# Patient Record
Sex: Female | Born: 1960 | Race: White | Hispanic: No | Marital: Married | Smoking: Never smoker
Health system: Southern US, Community
[De-identification: ages and names within clinical notes are randomized; demographics above are authoritative.]

## PROBLEM LIST (undated history)

## (undated) DIAGNOSIS — D259 Leiomyoma of uterus, unspecified: Secondary | ICD-10-CM

## (undated) DIAGNOSIS — K219 Gastro-esophageal reflux disease without esophagitis: Secondary | ICD-10-CM

## (undated) DIAGNOSIS — E039 Hypothyroidism, unspecified: Secondary | ICD-10-CM

## (undated) DIAGNOSIS — N840 Polyp of corpus uteri: Secondary | ICD-10-CM

## (undated) DIAGNOSIS — R112 Nausea with vomiting, unspecified: Secondary | ICD-10-CM

## (undated) DIAGNOSIS — Z85828 Personal history of other malignant neoplasm of skin: Secondary | ICD-10-CM

## (undated) DIAGNOSIS — H9190 Unspecified hearing loss, unspecified ear: Secondary | ICD-10-CM

## (undated) DIAGNOSIS — N938 Other specified abnormal uterine and vaginal bleeding: Secondary | ICD-10-CM

## (undated) DIAGNOSIS — E041 Nontoxic single thyroid nodule: Secondary | ICD-10-CM

## (undated) DIAGNOSIS — Z9889 Other specified postprocedural states: Secondary | ICD-10-CM

## (undated) DIAGNOSIS — Z85820 Personal history of malignant melanoma of skin: Secondary | ICD-10-CM

## (undated) DIAGNOSIS — N200 Calculus of kidney: Secondary | ICD-10-CM

## (undated) DIAGNOSIS — Z973 Presence of spectacles and contact lenses: Secondary | ICD-10-CM

## (undated) DIAGNOSIS — D500 Iron deficiency anemia secondary to blood loss (chronic): Secondary | ICD-10-CM

## (undated) DIAGNOSIS — K5900 Constipation, unspecified: Secondary | ICD-10-CM

## (undated) HISTORY — PX: BREAST EXCISIONAL BIOPSY: SUR124

## (undated) HISTORY — PX: BREAST BIOPSY: SHX20

## (undated) HISTORY — PX: APPENDECTOMY: SHX54

## (undated) HISTORY — PX: OTHER SURGICAL HISTORY: SHX169

## (undated) HISTORY — PX: WISDOM TOOTH EXTRACTION: SHX21

## (undated) HISTORY — PX: TONSILLECTOMY: SUR1361

---

## 2003-01-16 ENCOUNTER — Encounter: Admission: RE | Admit: 2003-01-16 | Discharge: 2003-01-16 | Payer: Self-pay | Admitting: Family Medicine

## 2003-01-16 ENCOUNTER — Encounter: Payer: Self-pay | Admitting: Family Medicine

## 2003-06-06 ENCOUNTER — Encounter: Payer: Self-pay | Admitting: Family Medicine

## 2003-06-06 ENCOUNTER — Encounter: Admission: RE | Admit: 2003-06-06 | Discharge: 2003-06-06 | Payer: Self-pay | Admitting: Family Medicine

## 2003-07-15 ENCOUNTER — Encounter: Admission: RE | Admit: 2003-07-15 | Discharge: 2003-08-14 | Payer: Self-pay | Admitting: Neurosurgery

## 2003-09-26 ENCOUNTER — Ambulatory Visit (HOSPITAL_COMMUNITY): Admission: RE | Admit: 2003-09-26 | Discharge: 2003-09-26 | Payer: Self-pay | Admitting: Neurosurgery

## 2004-01-30 ENCOUNTER — Encounter: Admission: RE | Admit: 2004-01-30 | Discharge: 2004-01-30 | Payer: Self-pay | Admitting: Family Medicine

## 2005-01-12 ENCOUNTER — Ambulatory Visit (HOSPITAL_COMMUNITY): Admission: RE | Admit: 2005-01-12 | Discharge: 2005-01-12 | Payer: Self-pay | Admitting: Gastroenterology

## 2005-07-08 ENCOUNTER — Inpatient Hospital Stay (HOSPITAL_COMMUNITY): Admission: AD | Admit: 2005-07-08 | Discharge: 2005-07-11 | Payer: Self-pay | Admitting: Obstetrics and Gynecology

## 2005-09-13 ENCOUNTER — Ambulatory Visit (HOSPITAL_COMMUNITY): Admission: AD | Admit: 2005-09-13 | Discharge: 2005-09-13 | Payer: Self-pay | Admitting: Obstetrics and Gynecology

## 2005-10-25 ENCOUNTER — Observation Stay (HOSPITAL_COMMUNITY): Admission: RE | Admit: 2005-10-25 | Discharge: 2005-10-25 | Payer: Self-pay | Admitting: Obstetrics and Gynecology

## 2005-10-27 ENCOUNTER — Encounter: Payer: Self-pay | Admitting: Obstetrics & Gynecology

## 2005-10-27 ENCOUNTER — Inpatient Hospital Stay (HOSPITAL_COMMUNITY): Admission: RE | Admit: 2005-10-27 | Discharge: 2005-10-30 | Payer: Self-pay | Admitting: Obstetrics and Gynecology

## 2005-11-09 ENCOUNTER — Ambulatory Visit (HOSPITAL_COMMUNITY): Admission: RE | Admit: 2005-11-09 | Discharge: 2005-11-09 | Payer: Self-pay | Admitting: Obstetrics & Gynecology

## 2005-11-28 ENCOUNTER — Other Ambulatory Visit: Admission: RE | Admit: 2005-11-28 | Discharge: 2005-11-28 | Payer: Self-pay | Admitting: Interventional Radiology

## 2005-11-28 ENCOUNTER — Encounter: Admission: RE | Admit: 2005-11-28 | Discharge: 2005-11-28 | Payer: Self-pay | Admitting: General Surgery

## 2005-11-28 ENCOUNTER — Encounter (INDEPENDENT_AMBULATORY_CARE_PROVIDER_SITE_OTHER): Payer: Self-pay | Admitting: *Deleted

## 2006-08-03 ENCOUNTER — Encounter: Admission: RE | Admit: 2006-08-03 | Discharge: 2006-08-03 | Payer: Self-pay | Admitting: General Surgery

## 2007-02-05 ENCOUNTER — Ambulatory Visit (HOSPITAL_COMMUNITY): Admission: RE | Admit: 2007-02-05 | Discharge: 2007-02-05 | Payer: Self-pay | Admitting: General Surgery

## 2008-12-08 ENCOUNTER — Ambulatory Visit (HOSPITAL_COMMUNITY): Admission: RE | Admit: 2008-12-08 | Discharge: 2008-12-08 | Payer: Self-pay | Admitting: Internal Medicine

## 2009-03-13 ENCOUNTER — Emergency Department (HOSPITAL_COMMUNITY): Admission: EM | Admit: 2009-03-13 | Discharge: 2009-03-13 | Payer: Self-pay | Admitting: Emergency Medicine

## 2010-12-09 ENCOUNTER — Ambulatory Visit: Payer: Self-pay | Admitting: Women's Health

## 2011-01-09 ENCOUNTER — Encounter: Payer: Self-pay | Admitting: Internal Medicine

## 2011-02-04 ENCOUNTER — Other Ambulatory Visit: Payer: Self-pay | Admitting: Women's Health

## 2011-02-04 ENCOUNTER — Other Ambulatory Visit (HOSPITAL_COMMUNITY)
Admission: RE | Admit: 2011-02-04 | Discharge: 2011-02-04 | Disposition: A | Discharge status: Home / Self Care | Payer: BC Managed Care – PPO | Source: Ambulatory Visit | Attending: Gynecology | Admitting: Gynecology

## 2011-02-04 ENCOUNTER — Ambulatory Visit (INDEPENDENT_AMBULATORY_CARE_PROVIDER_SITE_OTHER): Payer: BC Managed Care – PPO | Admitting: Women's Health

## 2011-02-04 DIAGNOSIS — Z01419 Encounter for gynecological examination (general) (routine) without abnormal findings: Secondary | ICD-10-CM

## 2011-02-04 DIAGNOSIS — R82998 Other abnormal findings in urine: Secondary | ICD-10-CM

## 2011-02-04 DIAGNOSIS — N920 Excessive and frequent menstruation with regular cycle: Secondary | ICD-10-CM

## 2011-02-04 DIAGNOSIS — Z124 Encounter for screening for malignant neoplasm of cervix: Secondary | ICD-10-CM | POA: Insufficient documentation

## 2011-02-22 ENCOUNTER — Encounter (INDEPENDENT_AMBULATORY_CARE_PROVIDER_SITE_OTHER): Payer: BC Managed Care – PPO | Admitting: Gynecology

## 2011-02-22 DIAGNOSIS — B3731 Acute candidiasis of vulva and vagina: Secondary | ICD-10-CM

## 2011-02-22 DIAGNOSIS — N898 Other specified noninflammatory disorders of vagina: Secondary | ICD-10-CM

## 2011-02-22 DIAGNOSIS — B373 Candidiasis of vulva and vagina: Secondary | ICD-10-CM

## 2011-03-14 ENCOUNTER — Ambulatory Visit: Payer: BC Managed Care – PPO | Admitting: Gynecology

## 2011-03-14 ENCOUNTER — Other Ambulatory Visit: Payer: BC Managed Care – PPO

## 2011-03-31 LAB — URINALYSIS, ROUTINE W REFLEX MICROSCOPIC
Bilirubin Urine: NEGATIVE
Ketones, ur: 15 mg/dL — AB
Leukocytes, UA: NEGATIVE
Nitrite: NEGATIVE
Urobilinogen, UA: 0.2 mg/dL (ref 0.0–1.0)
pH: 6 (ref 5.0–8.0)

## 2011-03-31 LAB — CBC
MCHC: 33.2 g/dL (ref 30.0–36.0)
MCV: 80.4 fL (ref 78.0–100.0)
Platelets: 274 10*3/uL (ref 150–400)
RBC: 4.32 MIL/uL (ref 3.87–5.11)
RDW: 16.3 % — ABNORMAL HIGH (ref 11.5–15.5)

## 2011-03-31 LAB — COMPREHENSIVE METABOLIC PANEL
ALT: 41 U/L — ABNORMAL HIGH (ref 0–35)
AST: 41 U/L — ABNORMAL HIGH (ref 0–37)
Albumin: 3.8 g/dL (ref 3.5–5.2)
CO2: 24 mEq/L (ref 19–32)
Calcium: 9 mg/dL (ref 8.4–10.5)
Creatinine, Ser: 0.68 mg/dL (ref 0.4–1.2)
GFR calc Af Amer: >60 mL/min (ref >60)
GFR calc non Af Amer: >60 mL/min (ref >60)
Sodium: 139 mEq/L (ref 135–145)
Total Protein: 6.4 g/dL (ref 6.0–8.3)

## 2011-03-31 LAB — LIPASE, BLOOD: Lipase: 20 U/L (ref 11–59)

## 2011-03-31 LAB — DIFFERENTIAL
Eosinophils Absolute: 0.1 10*3/uL (ref 0.0–0.7)
Eosinophils Relative: 1 % (ref 0–5)
Lymphocytes Relative: 12 % (ref 12–46)
Lymphs Abs: 1.2 10*3/uL (ref 0.7–4.0)
Monocytes Absolute: 0.4 10*3/uL (ref 0.1–1.0)
Monocytes Relative: 4 % (ref 3–12)

## 2011-05-06 NOTE — Discharge Summary (Signed)
NAME:  [name], [name]              ACCOUNT NO.:  [id number]   MEDICAL RECORD NO.:  [id number]          PATIENT TYPE:  OIB   LOCATION:  LDR2                          FACILITY:  [facility]   PHYSICIAN:  Tilda Burrow, M.D. DATE OF BIRTH:  [birth year]   DATE OF ADMISSION:  10/25/2005  DATE OF DISCHARGE:  11/07/2006LH                                 DISCHARGE SUMMARY   OBSERVATION SUMMARY:  Sandy White came to labor and delivery at approximately 6  a.m. with complaints of contractions.  She was examined upon admission and  she was noted to be 1.5 cm dilated, still fixed, soft and -2.  She was  observed for over 6 hours.  She is having contractions that are mild in  intensity, however, frequency is still a little regular at 5-7 minutes.  She  walked around for several hours and still the examination 6-1/2 hours after  her initial exam was still unchanged.  Fetal heart rate is reactive without  decelerations.  Options given to Sandy White were to stay in the hospital on  observation status and see what happens, go home with the plan to return to  labor and delivery when contractions get a little stronger and perhaps more  regular or any concerns such as decreased fetal movement, rupture of  membranes, bleeding or receive therapy with IM morphine with the idea that  the contractions will either stop or will become more efficient.  She chose  the option of going home and just return later on if the contractions  increase.  She was given instructions on kick counts and return and followup  if she has any concerns over fetal movement.  A cervical exam upon discharge  was still 1.5 cm, very thick, soft and -2 station.  She did have a reactive  nonstress test prior to discharge.      Jacklyn Shell, C.N.M.      Tilda Burrow, M.D.  Electronically Signed    FC/MEDQ  D:  10/25/2005  T:  10/25/2005  Job:  [id number]   cc:   [facility]

## 2011-05-06 NOTE — Op Note (Signed)
NAME:  Sandy White, Sandy White              ACCOUNT NO.:  [id number]   MEDICAL RECORD NO.:  [id number]          PATIENT TYPE:  INP   LOCATION:  A412                          FACILITY:  [facility]   PHYSICIAN:  Lazaro Arms, M.D.   DATE OF BIRTH:  Jan 14, 1961   DATE OF PROCEDURE:  DATE OF DISCHARGE:  10/30/2005                                 OPERATIVE REPORT   PREOPERATIVE DIAGNOSES:  1.  Intrauterine pregnancy at 8 weeks' gestation.  2.  Repetitive deep variable decelerations with occasional fetal bradycardic      episode.   POSTOPERATIVE DIAGNOSES:  1.  Intrauterine pregnancy at 56 weeks' gestation.  2.  Repetitive deep variable decelerations with occasional fetal bradycardic      episode.   PROCEDURE:  Primary low transverse cesarean section.   SURGEON:  Lazaro Arms, M.D.   ANESTHESIA:  Epidural.   FINDINGS:  Over a low transverse hysterotomy incision was delivered a viable  female infant with Apgars 8 and 9.  There was a three-vessel cord.  Cord  blood and cord gas sent.  The placenta was normal and intact.  There was no  nuchal cord.  Uterus, tubes, and ovaries were otherwise normal.   DESCRIPTION OF OPERATION:  The patient was taken to the operating room and  placed in the supine position with a roll under her right hip.  She had an  epidural that was working.   The patient was prepped and draped in the usual sterile fashion.  A  Pfannenstiel skin incision was made and carried down sharply to the rectus  fascia, scored in the midline and extended laterally.  The fascia was taken  off the muscles superiorly and inferiorly without difficulty.  The muscles  were divided.  The peritoneal cavity was entered.  Bladder blade was placed.  Vesicouterine serosa flap was created.  A low transverse hysterotomy  incision was made and over this incision was delivered a viable female with  Apgars 8 and 9.  Again, there was a three-vessel cord.  No nuchal cord.  No  other reason that we could  determine for the fetal heart rate changes.  The  infant was handed to Dr. Milford Cage, who was in attendance for routine neonatal  resuscitation.  The placenta was delivered spontaneously.  The uterus was  wiped clean with a lap pad, closed with two layers, the first being a  running interlocking layer and the second being an imbricating layer.  Bilateral tubal ligation was then performed without difficulty and good  hemostasis.  The pelvis was irrigated vigorously.  Muscles were  reapproximated loosely.  The fascia was closed using a 0 Vicryl  running.  Subcutaneous tissues were made hemostatic and irrigated.  Skin was  closed using skin staples.  The patient tolerated the procedure well.  She  experienced 700 mL of blood loss, taken to the recovery room in good stable  condition.  All counts were correct x3.      Lazaro Arms, M.D.  Electronically Signed     LHE/MEDQ  D:  11/16/2005  T:  11/16/2005  Job:  [id number]

## 2011-05-06 NOTE — Consult Note (Signed)
NAME:  Sandy White, Sandy White              ACCOUNT NO.:  [id number]   MEDICAL RECORD NO.:  [id number]          PATIENT TYPE:  OIB   LOCATION:  LDR1                          FACILITY:  [facility]   PHYSICIAN:  Tilda Burrow, M.D. DATE OF BIRTH:  20-May-1961   DATE OF CONSULTATION:  09/13/2005  DATE OF DISCHARGE:  09/13/2005                                   CONSULTATION   CHIEF COMPLAINT:  Uterine irritability.   HISTORY OF PRESENT ILLNESS:  This 50 year old gravida 4, para 3, AB 0 with  three living children, LMP January 30, 2005 placing [facility] November 06, 2005  with corresponding first and second trimester ultrasounds, second trimester  [facility] of October 30, 2005 based on 20 week ultrasound suggesting accelerated  fetal growth.  She has been followed through our office and presents to  labor and delivery complaining of a sense of not feeling well all day and  then presenting to labor and delivery complaining of some cramping.  External monitor shows the patient to be having irregular contractions every  5-8 minutes.  Cervix is multiparous, open external os 2 cm and internal os  closed.  Size appears greater than dates of 32 plus 3 weeks.   PAST MEDICAL HISTORY:  Positive for gastroesophageal reflux disease.   SURGICAL HISTORY:  Appendectomy and tonsillectomy.   CIGARETTES, ALCOHOL AND RECREATIONAL DRUGS:  Denied.   PHYSICAL EXAMINATION:  Notable for reactive fetal heart rate tracing,  abdominal exam showing a soft uterus measuring 33 cm, and ultrasound is  performed by Jannifer Franklin revealing polyhydramnios with singleton vertex  presentation and generous amniotic fluid with slight funneling of the lower  uterine segment and internal os.  As stated earlier digital exam shows the  external os 2 cm and internal os closed.   IMPRESSION:  Preterm labor secondary to polyhydramnios.   PLAN:  1.  Single dose terbutaline administered.  2.  We will give initial dose of betamethasone 12 mg IM  tonight.  3.  Monitor x1 hour.  If complete resolution of contractions we will send      home with followup.  Followup second dose of betamethasone Thursday      morning our office.  4.  Additionally, we will begin Valtrex suppression at this time with 500 mg      p.o. daily for duration of pregnancy.     Tilda Burrow, M.D.  Electronically Signed    JVF/MEDQ  D:  09/13/2005  T:  09/13/2005  Job:  [id number]

## 2011-05-06 NOTE — Discharge Summary (Signed)
NAME:  Sandy White, Sandy White              ACCOUNT NO.:  [id number]   MEDICAL RECORD NO.:  [id number]          PATIENT TYPE:  INP   LOCATION:  A412                          FACILITY:  [facility]   PHYSICIAN:  Lazaro Arms, M.D.   DATE OF BIRTH:  November 25, 1961   DATE OF ADMISSION:  10/27/2005  DATE OF DISCHARGE:  11/12/2006LH                                 DISCHARGE SUMMARY   PREOPERATIVE DIAGNOSES:  1.  Status post low transverse cesarean section and bilateral tubal      ligation.  2.  Repetitive fetal bradycardic episodes and variable accelerations.  3.  Unremarkable postoperative course.   PROCEDURES:  Low transverse cervical cesarean section/bilateral tubal  ligation.   HISTORY:  Please refer to the transcribed history and physical in antepartum  chart for details of admission to the hospital.   HOSPITAL COURSE:  Patient was admitted in early labor.  She underwent  epidural anesthesia without difficulty.  Blood pressure was stable.  Fetal  heart tracing started showing variable decelerations which were not  responsive to usual conservative measures.  As a result we proceeded with a  low transverse cesarean section.  Please refer to the op note for details.  She also had a tubal ligation.  Her postoperative course was unremarkable.  She tolerated clear liquids and a regular diet, voided without symptoms and  was ambulatory.  She had a stable hemoglobin and hematocrit at 9.5 postop  day #1 and #3.  She was discharged to home morning of postop day #3 in good  stable condition to follow up in the office next week to have staples  removed.  She was given Tylox for pain, instructions/precautions for return  prior to that time.      Lazaro Arms, M.D.  Electronically Signed     LHE/MEDQ  D:  11/16/2005  T:  11/16/2005  Job:  [id number]

## 2011-05-06 NOTE — Op Note (Signed)
NAME:  TOMORROW, [name]              ACCOUNT NO.:  [id number]   MEDICAL RECORD NO.:  [id number]          PATIENT TYPE:  INP   LOCATION:  A412                          FACILITY:  [facility]   PHYSICIAN:  Lazaro Arms, M.D.   DATE OF BIRTH:  [birth year]   DATE OF PROCEDURE:  10/27/2005  DATE OF DISCHARGE:  10/30/2005                                 OPERATIVE REPORT   PROCEDURE:  Epidural.   DESCRIPTION:  Sandy White is in early active labor.  She has gotten to 4 cm and  is requesting an epidural.  The patient is placed in the sitting position.  Betadine prep is used.  A 1% lidocaine is used as a local anesthetic.  The  area is field draped.  The L3-L4 interspace is identified.  A 17-gauge Tuohy  needle was used.  The loss of resistance technique employed.  The epidural  space is found with one pass without difficulty.  Ten mL of 0.125%  bupivacaine plain is given as a test dose without ill effects.  An  additional 10 mL is given to dose up the epidural.  The epidural catheter is  fed 5 cm into the epidural space.  The continuous infusion of 0.125%  bupivacaine with 2 mcg/mL of fentanyl is begun at 12 mL/hr.  The patient is  tolerating well, getting good relief with no drop in blood pressure.      Lazaro Arms, M.D.  Electronically Signed     LHE/MEDQ  D:  11/16/2005  T:  11/16/2005  Job:  [id number]

## 2011-05-06 NOTE — Discharge Summary (Signed)
NAME:  Sandy White, Sandy White              ACCOUNT NO.:  [id number]   MEDICAL RECORD NO.:  [id number]          PATIENT TYPE:  INP   LOCATION:  LDR3                          FACILITY:  [facility]   PHYSICIAN:  Tilda Burrow, M.D. DATE OF BIRTH:  01-07-1961   DATE OF ADMISSION:  07/08/2005  DATE OF DISCHARGE:  07/24/2006LH                                 DISCHARGE SUMMARY   ADMITTING DIAGNOSES:  1.  Pregnancy, 22 and 5/7th's weeks.  2.  Severe cervicitis, rule out chorioamnionitis.   DISCHARGE DIAGNOSES:  1.  Pregnancy, 23 weeks' gestation, not delivered.  2.  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   DISCHARGE MEDICATIONS:  Valtrex 500 mg p.o. b.i.d. x7 days, then one tablet  p.o. every day for duration of pregnancy.   HOSPITAL SUMMARY:  This 50 year old female was admitted as described in the  admitting history, after a two-day history of low-grade fever and discomfort  with speculum exam showing a fern test positive from the watery discharge  suggesting the possibility of membrane rupture but she was Nitrazine  negative.  The cervix had an ulcerative lesion suggestive of primary herpes.  A culture was obtained.  The patient was placed on clindamycin as well as  intravenous acyclovir 400 mg IV q.8h. with the patient discussed with Dr.  Ander Slade maternal/fetal medicine on call at [facility].  The patient  responded to antibiotics despite temperature to 102 on post-op day one with  subjective malaise improved gradually.  On July 10, 2005, the patient had  some continued vaginal discharge but markedly decreased from before.  On  July 11, 2005, she had mild uterine tenderness but no contractions.  Fetal  heart rate was in the normal range and so, the patient was considered stable  for discharge.   A torch titer had been ordered which was notable as follows:  Torch titer  revealed the patient to be negative for antibodies type 1 and type 2  (interestingly a culture from the office  subsequently returned positive).  Rubella immunity was absent.  Toxoplasmosis negative.  CMV, IgM was negative  as well.  HSV antibody titer was equivocal at 0.98 with normal being less  than 0.90.  Rubella antibodies were present.  Toxoplasmosis antibodies IgG  was negative.  Herpes simplex type 1 and 2 IgG was negative.   The patient was discharged home and one day later, after we found the  results in the office that showed positive HSV-2 culture from the cervix, we  resumed Valtrex at home.   This is a most interesting case in that the fern test prior to admission was  unequivocally positive and suggestive of membrane rupture, though clinical  circumstance turned out obviously otherwise.      Tilda Burrow, M.D.  Electronically Signed     JVF/MEDQ  D:  08/17/2005  T:  08/17/2005  Job:  [id number]   cc:   Tilda Burrow, M.D.  Fax: [phone]

## 2011-05-06 NOTE — Op Note (Signed)
Sandy White, Sandy White                ACCOUNT NO.:  [id number]   MEDICAL RECORD NO.:  [id number]          PATIENT TYPE:  AMB   LOCATION:  ENDO                         [facility]ACILITY:  [facility]   PHYSICIAN:  John C. Madilyn Fireman, M.D.    DATE OF BIRTH:  1961-11-09   DATE OF PROCEDURE:  01/12/2005  DATE OF DISCHARGE:                                 OPERATIVE REPORT   INDICATIONS FOR PROCEDURE:  Chronic solid food dysphagia associated with  reflux symptoms suggestive of esophageal ring or stricture.   PROCEDURE:  The patient was placed in the left lateral decubitus position  and placed on the pulse monitor with continuous low-flow oxygen delivered by  nasal cannula. She was sedated with 50 mcg IV fentanyl and 5 mg IV Versed.  The Olympus video endoscope was advanced under direct vision into the  oropharynx and esophagus. The esophagus was straight and of normal caliber  with squamocolumnar line at 38 cm with a fairly subtle esophageal ring or  stricture at the GE junction. There was general erythema and slight  friability of the mucosa in the distal esophagus and GE junction area but no  discrete erosions or ulcers. There is no resistance to passage of scope  beyond the GE junction. I could not clearly discern a hiatal hernia. Stomach  was entered. A small amount of liquid secretions were suctioned from the  fundus. Retroflexed view of cardia was unremarkable. The fundus, body,  antrum and pylorus all appeared normal. The duodenum was entered, and  both  bulb and second portion were well inspected and appeared to be within normal  limits. Savary guidewire was placed through the endoscope channel and the  scope withdrawn. Savary dilators of 16 and 17 mm were passed over the  guidewire with mild resistance and a small amount of blood seen on the last  dilator only. The last dilator was removed together with wire, and the  patient returned to the recovery room in stable condition. She tolerated the  procedure  well. There were no immediate complications.   IMPRESSION:  Lower esophageal ring or stricture.   PLAN:  Advance diet and observe response to dilatation. Will follow-up in  the office in a few weeks.      JCH/MEDQ  D:  01/12/2005  T:  01/12/2005  Job:  [id number]   cc:   Marjory Lies, M.D.  [address]  [place]  [place] [address]  Fax: [phone]

## 2011-05-06 NOTE — H&P (Signed)
NAME:  Sandy White, Sandy White NO.:  [id number]   MEDICAL RECORD NO.:  [id number]          PATIENT TYPE:  INP   LOCATION:  LDR2                          FACILITY:  [facility]   PHYSICIAN:  Tilda Burrow, M.D. DATE OF BIRTH:  1961-04-13   DATE OF ADMISSION:  10/27/2005  DATE OF DISCHARGE:  LH                                HISTORY & PHYSICAL   REASON FOR ADMISSION:  Pregnancy at 39 weeks in early labor.   HISTORY OF PRESENT ILLNESS:  Sandy White is a 50 year old gravida 4, para 3, EDC  November 19 who has been seen in the office yesterday with some mild  intermittent contractions with an unchanged cervix. She presents 3 cm with  regular uterine contractions.   PAST MEDICAL HISTORY:  Positive for HSV.   PAST SURGICAL HISTORY:  Positive for appendix and tonsils.   ALLERGIES:  She is allergic to CODEINE.   FAMILY HISTORY:  Positive for breast cancer and lung cancer.   SOCIAL HISTORY:  She is married. Her husband is present and supportive.   PRENATAL COURSE:  Essentially uneventful. 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 daily since September 15, 2005. Blood type is O positive. UDS is negative. Hepatitis B surface antigen  negative. HIV is negative. HSV is positive. Serology is nonreactive. Pap is  ASCUS. GC and chlamydia are negative. She declined AFP. GBS is negative.   PHYSICAL EXAMINATION:  Vital signs are stable. Fetal heart rate pattern is  stable. Uterine contractions were every 2 to 4 minutes, mild to moderate in  intensity. Cervix is 5 cm, 80% effaced, -1 station.   PLAN:  Expect vaginal delivery.      Zerita Boers, Lanier Clam      Tilda Burrow, M.D.  Electronically Signed    DL/MEDQ  D:  14/78/2956  T:  10/27/2005  Job:  [id number]   cc:   [facility]

## 2011-05-06 NOTE — H&P (Signed)
NAME:  Sandy White, Sandy White              ACCOUNT NO.:  [id number]   MEDICAL RECORD NO.:  [id number]          PATIENT TYPE:  INP   LOCATION:  LDR3                          FACILITY:  [facility]   PHYSICIAN:  Tilda Burrow, M.D. DATE OF BIRTH:  [birth year]   DATE OF ADMISSION:  07/08/2005  DATE OF DISCHARGE:  LH                                HISTORY & PHYSICAL   ADMISSION DIAGNOSES:  1.  Pregnancy at 22-5/7 weeks.  2.  Severe cervicitis, rule out chorioamnionitis.   HISTORY OF PRESENT ILLNESS:  This 50 year old female, G4, P3, AB0, LMP  January 30, 2005, with [facility] of November 06, 2005, with corresponding first  and second trimester ultrasounds.  She is 22-5/7 weeks by menstrual history.   PRENATAL COURSE:  Notable for advanced maternal age evaluated at [facility] with targeted ultrasound performed and no visible abnormalities  identified.  She had a uterine fibroid in the uterine fundus.  She had a  progesterone level of 24 which was excellent.  Pregnancy course has been  otherwise straight forward.   Current complaints began when she presented to our of office on July 06, 2005, after a 2-day history of a low-grade fever and discomfort at home.  Temperature at home was 101.  She complained of increased vaginal fluid and  urine on the morning of July 05, 2005.  Urinalysis and culture and  sensitivities were sent to the lab.  It has returned 10 to the 5th  lactobacillus felt to be contaminant.  Speculum exam showed that she had an  erythematous cervix with extensive leukorrhea.  Prior GC and Chlamydia  culture were negative.  Vaginal pH was 7.  A fern test was negative.  There  were numerous white cells in the vaginal secretions.  There was lots of  watery discharge which appeared to be weeping from the cervical tissues  themselves.  She was examined by Cyril Mourning and myself.  She was  treated for possible UTI and also treated for cervicitis receiving  azithromycin 500 mg with a  4-day supply, plus Keflex q.i.d. x7 days.  She  was followed up in 2 days.  She specifically denied any uterine contractions  and was instructed to return if any contractions developed or if she felt  worse.  Follow up 2 days later, today in the office, showed that she was  still leaking fluid and it had become heavier this morning since 4:30 a.m.  She had a fever of 101.8 this morning and continues to feel general malaise.  She specifically denies uterine contractions and at this point in time is  sent to labor and delivery after speculum exam reveals much heavier clear  discharge and the cervix is distinctly different as the entire cervix  anterior and posterior lips over the everted endocervical tissue shows a  white eschar and weepy tissue fluid.  The speculum in the posterior vaginal  vault shows generous fluid which is suspicious for membrane rupture or  transudate.  Crist Fat test is performed again and this time show characteristic  ferning,  and generous amountsof it.  She is admitted for treatment of  cervicitis, possible treatment of chorioamnionitis.  External monitoring at  the hospital here shows fetal heart rate at 16-165, with occasional variable  deceleration noted.  The fetus is extremely active.  Amniotic fluid is high  normal.  It should be noted that manipulation of the cervix with the  speculum showed no expulsion of fluid from the endocervical canal that  occurred.   PHYSICAL EXAMINATION:  VITAL SIGNS:  In the office today, vital signs showed  temperature 97.4, weight 144, blood pressure 124/64.  Temperature had  increased to 100.8 upon admission to the hospital when she was having chills  and fetal heart rate was increased.   ASSESSMENT/PLAN:  The case is discussed with Dr. Ander Slade, maternal fetal  medicine on-call physician at [facility].  Presentation was discussed  in detail and clinical management discussed.  Our plans are to treat for  possible chorioamnionitis  with ampicillin 2 g every 4 hours as well as  clindamycin 600 mg p.o. q.6h.  To obtain torch titer and also to treat with  intravenous acyclovir 400 mg IV q.8h. to cover.  I have discussed with  family the significant risk to the pregnancy that exists and we have  discussed options of care here versus transfer to tertiary facility.  The  family is aware that the option of transfer exists and they are comfortable  with staying here at this time.  Plans are for antibiotic therapy.  As  discussed with Dr. Ander Slade, if the patient does not improve in 48 hours, will  consider amniocentesis for assessment of amniotic fluid.       JVF/MEDQ  D:  07/08/2005  T:  07/08/2005  Job:  [id number]

## 2011-09-09 ENCOUNTER — Encounter: Payer: Self-pay | Admitting: Gynecology

## 2011-09-12 ENCOUNTER — Ambulatory Visit: Payer: BC Managed Care – PPO | Admitting: Women's Health

## 2011-09-29 ENCOUNTER — Other Ambulatory Visit (HOSPITAL_COMMUNITY): Payer: Self-pay | Admitting: Internal Medicine

## 2011-09-29 DIAGNOSIS — N200 Calculus of kidney: Secondary | ICD-10-CM

## 2011-09-30 ENCOUNTER — Ambulatory Visit (HOSPITAL_COMMUNITY): Payer: BC Managed Care – PPO

## 2011-10-10 ENCOUNTER — Encounter: Payer: Self-pay | Admitting: Women's Health

## 2011-10-10 ENCOUNTER — Ambulatory Visit (INDEPENDENT_AMBULATORY_CARE_PROVIDER_SITE_OTHER): Payer: BC Managed Care – PPO | Admitting: Women's Health

## 2011-10-10 VITALS — BP 120/70

## 2011-10-10 DIAGNOSIS — R350 Frequency of micturition: Secondary | ICD-10-CM

## 2011-10-10 DIAGNOSIS — R82998 Other abnormal findings in urine: Secondary | ICD-10-CM

## 2011-10-10 DIAGNOSIS — N920 Excessive and frequent menstruation with regular cycle: Secondary | ICD-10-CM

## 2011-10-10 DIAGNOSIS — G43909 Migraine, unspecified, not intractable, without status migrainosus: Secondary | ICD-10-CM | POA: Insufficient documentation

## 2011-10-10 NOTE — Progress Notes (Signed)
  Presents with a complaints was treated for UTI at primary care. UA today does show 2-3 WBCs and +1 bacteria will check a urine culture. Finished Septra about a week and a half ago, states feels much better. Also states cycles are extremely heavy and questions hysterectomy. Five-day monthly cycle/BTL. States uses 1-2 pads per hour, soils clothing, needs to get up at night 2-3 times due to heavy bleeding.  Exam: Abdomen is soft nontender, external genitalia is within normal limits, speculum exam cervix is pink scant discharge no odor or erythema were noted. Bimanual uterus is enlarged 8-10 weeks size, nontender, states does have a history of a fibroid.  Plan: Options reviewed, lysteda, ablation, hysterectomy. Will schedule sonohysterogram with Dr. Lily Peer, and review options at that time. Handout on ablation, hysterectomy were reviewed. Reviewed risks of surgery with infection, perforation, hemorrhage. Reviewed HER option is an office procedure. Will discuss at office visit with sonohysterogram.

## 2011-10-12 ENCOUNTER — Encounter: Payer: Self-pay | Admitting: Women's Health

## 2011-10-12 ENCOUNTER — Ambulatory Visit (INDEPENDENT_AMBULATORY_CARE_PROVIDER_SITE_OTHER): Payer: BC Managed Care – PPO | Admitting: Women's Health

## 2011-10-12 ENCOUNTER — Ambulatory Visit (INDEPENDENT_AMBULATORY_CARE_PROVIDER_SITE_OTHER): Payer: BC Managed Care – PPO

## 2011-10-12 VITALS — BP 120/70

## 2011-10-12 DIAGNOSIS — R1032 Left lower quadrant pain: Secondary | ICD-10-CM

## 2011-10-12 DIAGNOSIS — N920 Excessive and frequent menstruation with regular cycle: Secondary | ICD-10-CM

## 2011-10-12 DIAGNOSIS — D259 Leiomyoma of uterus, unspecified: Secondary | ICD-10-CM

## 2011-10-12 DIAGNOSIS — D251 Intramural leiomyoma of uterus: Secondary | ICD-10-CM

## 2011-10-12 DIAGNOSIS — N831 Corpus luteum cyst of ovary, unspecified side: Secondary | ICD-10-CM

## 2011-10-12 DIAGNOSIS — R350 Frequency of micturition: Secondary | ICD-10-CM

## 2011-10-12 MED ORDER — URIBEL 118 MG PO CAPS: 1.0000 | ORAL_CAPSULE | Freq: Four times a day (QID) | ORAL | Status: DC | PRN

## 2011-10-12 NOTE — Progress Notes (Signed)
  Presents with a complaint of left lower quadrant pain. Known fibroid uterus with menorrhagia and dysmenorrhea. Was seen in the office 2 days ago to check a urine for test of cure and left lower quadrant pain. She has been treated with Cipro, Macrobid and finally Septra by her primary care where she did get relief. States she still has some bladder discomfort, pressure and urgency, denies any pain at the end of the stream, and did have a negative urine culture on 10/22. Denies fever, no nausea/ vomiting/ diarrhea /constipation or changes in elimination. States has never had discomfort like this before, has missed work this week due to this discomfort.  Exam: no CVAT, abdominal exam, no rebound, slight discomfort in the suprapubic bladder area, some discomfort in the left lower quadrant that radiates towards the back with deep palpation. UA today does show 2-3 WBCs. .  Plan: Culture urine, ultrasound shows numerous intramural myomas. The endometrial cavity shows 2 echogenic foci 18 x 9mm and 11 x 7 mm. She is scheduled for a sonohysterogram. Right ovary does show a thick walled cyst 17 x 19 mm positive PFT, left ovary was normal. Patient states when any pressure with the ultrasound on the left side she did have increased pain. Will try Uribel 4 times a day for 2 weeks return for sonohysterogram with Dr. Lily Peer.

## 2012-02-17 ENCOUNTER — Other Ambulatory Visit (HOSPITAL_COMMUNITY): Payer: Self-pay | Admitting: Internal Medicine

## 2012-02-17 DIAGNOSIS — R1011 Right upper quadrant pain: Secondary | ICD-10-CM

## 2012-02-20 ENCOUNTER — Other Ambulatory Visit (HOSPITAL_COMMUNITY): Payer: Self-pay | Admitting: Internal Medicine

## 2012-02-20 ENCOUNTER — Ambulatory Visit (HOSPITAL_COMMUNITY)
Admission: RE | Admit: 2012-02-20 | Discharge: 2012-02-20 | Disposition: A | Discharge status: Home / Self Care | Payer: BC Managed Care – PPO | Source: Ambulatory Visit | Attending: Internal Medicine | Admitting: Internal Medicine

## 2012-02-20 DIAGNOSIS — Z139 Encounter for screening, unspecified: Secondary | ICD-10-CM

## 2012-02-20 DIAGNOSIS — R1011 Right upper quadrant pain: Secondary | ICD-10-CM

## 2012-02-20 DIAGNOSIS — K7689 Other specified diseases of liver: Secondary | ICD-10-CM | POA: Insufficient documentation

## 2012-02-21 ENCOUNTER — Other Ambulatory Visit (HOSPITAL_COMMUNITY): Payer: Self-pay | Admitting: Internal Medicine

## 2012-02-21 DIAGNOSIS — R1011 Right upper quadrant pain: Secondary | ICD-10-CM

## 2012-02-23 ENCOUNTER — Encounter (HOSPITAL_COMMUNITY)
Admission: RE | Admit: 2012-02-23 | Discharge: 2012-02-23 | Disposition: A | Discharge status: Home / Self Care | Payer: BC Managed Care – PPO | Source: Ambulatory Visit | Attending: Internal Medicine | Admitting: Internal Medicine

## 2012-02-23 ENCOUNTER — Encounter (HOSPITAL_COMMUNITY): Payer: Self-pay

## 2012-02-23 DIAGNOSIS — R1011 Right upper quadrant pain: Secondary | ICD-10-CM

## 2012-02-23 MED ORDER — TECHNETIUM TC 99M MEBROFENIN IV KIT
5.0000 | PACK | Freq: Once | INTRAVENOUS | Status: AC | PRN
  Administered 2012-02-23: 5.6 via INTRAVENOUS

## 2012-02-23 MED ORDER — SINCALIDE 5 MCG IJ SOLR
INTRAMUSCULAR | Status: AC
  Administered 2012-02-23: 1.27 ug
  Filled 2012-02-23: qty 5

## 2012-02-27 ENCOUNTER — Ambulatory Visit (HOSPITAL_COMMUNITY)
Admission: RE | Admit: 2012-02-27 | Discharge: 2012-02-27 | Disposition: A | Discharge status: Home / Self Care | Payer: BC Managed Care – PPO | Source: Ambulatory Visit | Attending: Internal Medicine | Admitting: Internal Medicine

## 2012-02-27 DIAGNOSIS — Z139 Encounter for screening, unspecified: Secondary | ICD-10-CM

## 2012-02-27 DIAGNOSIS — Z1231 Encounter for screening mammogram for malignant neoplasm of breast: Secondary | ICD-10-CM | POA: Insufficient documentation

## 2012-02-29 ENCOUNTER — Other Ambulatory Visit: Payer: Self-pay | Admitting: Internal Medicine

## 2012-02-29 DIAGNOSIS — R928 Other abnormal and inconclusive findings on diagnostic imaging of breast: Secondary | ICD-10-CM

## 2012-03-06 ENCOUNTER — Ambulatory Visit
Admission: RE | Admit: 2012-03-06 | Discharge: 2012-03-06 | Disposition: A | Discharge status: Home / Self Care | Payer: BC Managed Care – PPO | Source: Ambulatory Visit | Attending: Internal Medicine | Admitting: Internal Medicine

## 2012-03-06 DIAGNOSIS — R928 Other abnormal and inconclusive findings on diagnostic imaging of breast: Secondary | ICD-10-CM

## 2012-03-07 ENCOUNTER — Encounter (INDEPENDENT_AMBULATORY_CARE_PROVIDER_SITE_OTHER): Payer: Self-pay | Admitting: *Deleted

## 2012-03-21 ENCOUNTER — Ambulatory Visit (INDEPENDENT_AMBULATORY_CARE_PROVIDER_SITE_OTHER): Payer: BC Managed Care – PPO | Admitting: Internal Medicine

## 2012-03-21 ENCOUNTER — Inpatient Hospital Stay (HOSPITAL_COMMUNITY): Admission: RE | Admit: 2012-03-21 | Payer: BC Managed Care – PPO | Source: Ambulatory Visit

## 2012-03-26 ENCOUNTER — Ambulatory Visit (INDEPENDENT_AMBULATORY_CARE_PROVIDER_SITE_OTHER): Payer: BC Managed Care – PPO | Admitting: Internal Medicine

## 2012-04-04 ENCOUNTER — Encounter (INDEPENDENT_AMBULATORY_CARE_PROVIDER_SITE_OTHER): Payer: Self-pay | Admitting: Internal Medicine

## 2012-04-04 ENCOUNTER — Encounter (INDEPENDENT_AMBULATORY_CARE_PROVIDER_SITE_OTHER): Payer: Self-pay | Admitting: *Deleted

## 2012-04-04 ENCOUNTER — Ambulatory Visit (INDEPENDENT_AMBULATORY_CARE_PROVIDER_SITE_OTHER): Payer: BC Managed Care – PPO | Admitting: Internal Medicine

## 2012-04-04 ENCOUNTER — Other Ambulatory Visit (INDEPENDENT_AMBULATORY_CARE_PROVIDER_SITE_OTHER): Payer: Self-pay | Admitting: *Deleted

## 2012-04-04 VITALS — BP 84/70 | HR 60 | Temp 98.1°F | Ht 62.0 in | Wt 138.4 lb

## 2012-04-04 DIAGNOSIS — E039 Hypothyroidism, unspecified: Secondary | ICD-10-CM

## 2012-04-04 DIAGNOSIS — K219 Gastro-esophageal reflux disease without esophagitis: Secondary | ICD-10-CM

## 2012-04-04 DIAGNOSIS — N3010 Interstitial cystitis (chronic) without hematuria: Secondary | ICD-10-CM

## 2012-04-04 NOTE — Progress Notes (Signed)
Subjective:     Patient ID: Sandy White, female   DOB: 1961-11-05, 51 y.o.   MRN: [id number]  HPI Referred by Dr. Ouida Sills for epigastric pain radiating around rt upper quadrant.  Her symptoms started 2 months ago.  Symptoms worse after eating .She says for a while it was a constant pain.  She rated the pain 9/10. The first episode was 2 yrs ago and she thought she was having a heart attack and she presented to the ED.  She has acid reflux usually at night every night.  Foods have also been slow to go down. She has a hx of dysphagia and underwent an EGD/ED in 2006 by Dr. Madilyn Fireman.  ( see below) She is not only any medication for this.  There has been no weight loss. No nausea or vomiting.  She has a BM once a day. No melena or bright red rectal bleeding.  03/02/2012:Total bili 0.3, ALP 87, AST 33, ALT 46, H and H. 13.6 and 40.2, MCV 89 02/23/2012 HIDA scan: patenct of cystic and CBD. Normal GB EF. 96%.  02/20/2012: US abdomen: CBD 5mm.  Negative abdominal US. 10/26/2012 CT abdomen for hematuria:  No acute findings within the abdomen or pelvis. No findings to explain hematuria. Fibroid uterus.Ordered by [facility]. . CT hematuria protocol.   2006 EGD/ED Dr. Vertell Novak REPORT  INDICATIONS FOR PROCEDURE: Chronic solid food dysphagia.  IMPRESSION: Lower esophageal ring or stricture.  PLAN: Advance diet and observe response to dilatation. Will follow-up in  the office in a few weeks.    Review of Systems     Current Outpatient Prescriptions  Medication Sig Dispense Refill  . Levothyroxine Sodium (SYNTHROID PO) Take 10 mcg by mouth daily.       . Meth-Hyo-M Bl-Na Phos-Ph Sal (URIBEL) 118 MG CAPS Take 1 capsule (118 mg total) by mouth 4 (four) times daily as needed.  56 capsule  1  . solifenacin (VESICARE) 10 MG tablet Take 5 mg by mouth daily.       Past Medical History  Diagnosis Date  . Thyroid disease     Hypothyroid  . Anemia   . Fibroid   . Migraine   .  Interstitial cystitis    Past Surgical History  Procedure Date  . Cesarean section 10/27/2005    girl  . Tubal ligation 10/27/2005   History   Social History  . Marital Status: Married    Spouse Name: N/A    Number of Children: N/A  . Years of Education: N/A   Occupational History  . Not on file.   Social History Main Topics  . Smoking status: Never Smoker   . Smokeless tobacco: Never Used  . Alcohol Use: No  . Drug Use: No  . Sexually Active: Yes -- Female partner(s)    Birth Control/ Protection: Surgical     btl   Other Topics Concern  . Not on file   Social History Narrative  . No narrative on file   Family Status  Relation Status Death Age  . Mother Deceased   . Father Deceased     MI age  . Sister Alive   . Maternal Grandmother Deceased   . Sister Alive    Allergies  Allergen Reactions  . Codeine     Objective:   Physical Exam Filed Vitals:   04/04/12 1600  Height: 5\' 2"  (1.575 m)  Weight: 138 lb 6.4 oz (62.778 kg)  Alert and oriented. Skin warm  and dry. Oral mucosa is moist.   . Sclera anicteric, conjunctivae is pink. Thyroid not enlarged. No cervical lymphadenopathy. Lungs clear. Heart regular rate and rhythm.  Abdomen is soft. Bowel sounds are positive. No hepatomegaly. No abdominal masses felt. No tenderness.  No edema to lower extremities. Patient is alert and oriented.      Assessment:   Possible GERD. PUD needs to be ruled out. Esophageal stricture also needs to be ruled out given hx of dysphagia and prior hx of stricture.    Plan:   EGD/ED. Samples of Dexilant given to patient (3 boxes given to patient)

## 2012-04-04 NOTE — Patient Instructions (Signed)
EGD/ED. Dexilant samples given to patient. 3 boxes

## 2012-04-06 ENCOUNTER — Encounter (INDEPENDENT_AMBULATORY_CARE_PROVIDER_SITE_OTHER): Payer: Self-pay

## 2012-04-11 MED ORDER — SODIUM CHLORIDE 0.45 % IV SOLN: Freq: Once | INTRAVENOUS | Status: DC

## 2012-05-16 ENCOUNTER — Encounter (HOSPITAL_COMMUNITY): Admission: RE | Payer: Self-pay | Source: Ambulatory Visit

## 2012-05-16 ENCOUNTER — Ambulatory Visit (HOSPITAL_COMMUNITY)
Admission: RE | Admit: 2012-05-16 | Payer: BC Managed Care – PPO | Source: Ambulatory Visit | Admitting: Internal Medicine

## 2012-05-16 SURGERY — ESOPHAGOGASTRODUODENOSCOPY (EGD) WITH ESOPHAGEAL DILATION
Anesthesia: Moderate Sedation

## 2013-02-26 ENCOUNTER — Other Ambulatory Visit: Payer: Self-pay

## 2013-02-26 DIAGNOSIS — Z1231 Encounter for screening mammogram for malignant neoplasm of breast: Secondary | ICD-10-CM

## 2013-04-03 ENCOUNTER — Ambulatory Visit: Payer: BC Managed Care – PPO

## 2013-04-22 ENCOUNTER — Ambulatory Visit
Admission: RE | Admit: 2013-04-22 | Discharge: 2013-04-22 | Disposition: A | Discharge status: Home / Self Care | Payer: BC Managed Care – PPO | Source: Ambulatory Visit

## 2013-04-22 DIAGNOSIS — Z1231 Encounter for screening mammogram for malignant neoplasm of breast: Secondary | ICD-10-CM

## 2013-06-18 HISTORY — PX: OTHER SURGICAL HISTORY: SHX169

## 2013-07-04 ENCOUNTER — Other Ambulatory Visit: Payer: Self-pay | Admitting: Internal Medicine

## 2013-07-04 DIAGNOSIS — N63 Unspecified lump in unspecified breast: Secondary | ICD-10-CM

## 2013-07-16 ENCOUNTER — Ambulatory Visit
Admission: RE | Admit: 2013-07-16 | Discharge: 2013-07-16 | Disposition: A | Discharge status: Home / Self Care | Payer: BC Managed Care – PPO | Source: Ambulatory Visit | Attending: Internal Medicine | Admitting: Internal Medicine

## 2013-07-16 DIAGNOSIS — N63 Unspecified lump in unspecified breast: Secondary | ICD-10-CM

## 2014-04-29 ENCOUNTER — Other Ambulatory Visit: Payer: Self-pay

## 2014-04-29 DIAGNOSIS — Z1231 Encounter for screening mammogram for malignant neoplasm of breast: Secondary | ICD-10-CM

## 2014-06-05 ENCOUNTER — Ambulatory Visit: Payer: BC Managed Care – PPO

## 2014-06-09 ENCOUNTER — Ambulatory Visit: Payer: BC Managed Care – PPO

## 2014-07-01 ENCOUNTER — Ambulatory Visit: Payer: BC Managed Care – PPO

## 2014-07-17 ENCOUNTER — Ambulatory Visit: Payer: BC Managed Care – PPO

## 2014-10-20 ENCOUNTER — Encounter (INDEPENDENT_AMBULATORY_CARE_PROVIDER_SITE_OTHER): Payer: Self-pay | Admitting: Internal Medicine

## 2015-02-05 ENCOUNTER — Ambulatory Visit (INDEPENDENT_AMBULATORY_CARE_PROVIDER_SITE_OTHER): Payer: BC Managed Care – PPO | Admitting: Gynecology

## 2015-02-05 ENCOUNTER — Other Ambulatory Visit (HOSPITAL_COMMUNITY)
Admission: RE | Admit: 2015-02-05 | Discharge: 2015-02-05 | Disposition: A | Discharge status: Home / Self Care | Payer: BC Managed Care – PPO | Source: Ambulatory Visit | Attending: Gynecology | Admitting: Gynecology

## 2015-02-05 ENCOUNTER — Encounter: Payer: Self-pay | Admitting: Gynecology

## 2015-02-05 VITALS — BP 132/86 | Ht 63.0 in | Wt 142.0 lb

## 2015-02-05 DIAGNOSIS — N8320 Unspecified ovarian cysts: Secondary | ICD-10-CM

## 2015-02-05 DIAGNOSIS — Z01419 Encounter for gynecological examination (general) (routine) without abnormal findings: Secondary | ICD-10-CM | POA: Insufficient documentation

## 2015-02-05 DIAGNOSIS — N3010 Interstitial cystitis (chronic) without hematuria: Secondary | ICD-10-CM

## 2015-02-05 DIAGNOSIS — E039 Hypothyroidism, unspecified: Secondary | ICD-10-CM

## 2015-02-05 DIAGNOSIS — Z1151 Encounter for screening for human papillomavirus (HPV): Secondary | ICD-10-CM | POA: Insufficient documentation

## 2015-02-05 DIAGNOSIS — N83201 Unspecified ovarian cyst, right side: Secondary | ICD-10-CM | POA: Insufficient documentation

## 2015-02-05 DIAGNOSIS — D251 Intramural leiomyoma of uterus: Secondary | ICD-10-CM

## 2015-02-05 DIAGNOSIS — N3281 Overactive bladder: Secondary | ICD-10-CM

## 2015-02-05 DIAGNOSIS — N924 Excessive bleeding in the premenopausal period: Secondary | ICD-10-CM

## 2015-02-05 DIAGNOSIS — Z803 Family history of malignant neoplasm of breast: Secondary | ICD-10-CM

## 2015-02-05 DIAGNOSIS — N946 Dysmenorrhea, unspecified: Secondary | ICD-10-CM | POA: Insufficient documentation

## 2015-02-05 LAB — CHOLESTEROL, TOTAL: Cholesterol: 203 mg/dL — ABNORMAL HIGH (ref 0–200)

## 2015-02-05 LAB — CBC WITH DIFFERENTIAL/PLATELET
BASOS PCT: 1 % (ref 0–1)
Basophils Absolute: 0.1 10*3/uL (ref 0.0–0.1)
Eosinophils Absolute: 0.3 10*3/uL (ref 0.0–0.7)
Eosinophils Relative: 4 % (ref 0–5)
HCT: 38.6 % (ref 36.0–46.0)
Hemoglobin: 12.3 g/dL (ref 12.0–15.0)
Lymphocytes Relative: 22 % (ref 12–46)
Lymphs Abs: 1.8 10*3/uL (ref 0.7–4.0)
MCH: 27.2 pg (ref 26.0–34.0)
MCHC: 31.9 g/dL (ref 30.0–36.0)
MCV: 85.2 fL (ref 78.0–100.0)
MPV: 9.9 fL (ref 8.6–12.4)
Monocytes Absolute: 0.7 10*3/uL (ref 0.1–1.0)
Monocytes Relative: 8 % (ref 3–12)
NEUTROS ABS: 5.3 10*3/uL (ref 1.7–7.7)
NEUTROS PCT: 65 % (ref 43–77)
PLATELETS: 361 10*3/uL (ref 150–400)
RBC: 4.53 MIL/uL (ref 3.87–5.11)
RDW: 14.5 % (ref 11.5–15.5)
WBC: 8.2 10*3/uL (ref 4.0–10.5)

## 2015-02-05 LAB — COMPREHENSIVE METABOLIC PANEL
ALBUMIN: 4 g/dL (ref 3.5–5.2)
ALT: 36 U/L — AB (ref 0–35)
AST: 25 U/L (ref 0–37)
Alkaline Phosphatase: 151 U/L — ABNORMAL HIGH (ref 39–117)
BUN: 13 mg/dL (ref 6–23)
CALCIUM: 9.6 mg/dL (ref 8.4–10.5)
CHLORIDE: 104 meq/L (ref 96–112)
CO2: 29 meq/L (ref 19–32)
CREATININE: 0.74 mg/dL (ref 0.50–1.10)
Glucose, Bld: 81 mg/dL (ref 70–99)
Potassium: 3.8 mEq/L (ref 3.5–5.3)
SODIUM: 142 meq/L (ref 135–145)
TOTAL PROTEIN: 6.7 g/dL (ref 6.0–8.3)
Total Bilirubin: 0.2 mg/dL (ref 0.2–1.2)

## 2015-02-05 LAB — TSH: TSH: 0.4 u[IU]/mL (ref 0.350–4.500)

## 2015-02-05 NOTE — Patient Instructions (Signed)
Transvaginal Ultrasound Transvaginal ultrasound is a pelvic ultrasound, using a metal probe that is placed in the vagina, to look at a women's female organs. Transvaginal ultrasound is a method of seeing inside the pelvis of a woman. The ultrasound machine sends out sound waves from the transducer (probe). These sound waves bounce off body structures (like an echo) to create a picture. The picture shows up on a monitor. It is called transvaginal because the probe is inserted into the vagina. There should be very little discomfort from the vaginal probe. This test can also be used during pregnancy. Endovaginal ultrasound is another name for a transvaginal ultrasound. In a transabdominal ultrasound, the probe is placed on the outside of the belly. This method gives pictures that are lower quality than pictures from the transvaginal technique. Transvaginal ultrasound is used to look for problems of the female genital tract. Some such problems include:  Infertility problems.  Congenital (birth defect) malformations of the uterus and ovaries.  Tumors in the uterus.  Abnormal bleeding.  Ovarian tumors and cysts.  Abscess (inflamed tissue around pus) in the pelvis.  Unexplained abdominal or pelvic pain.  Pelvic infection. DURING PREGNANCY, TRANSVAGINAL ULTRASOUND MAY BE USED TO LOOK AT:  Normal pregnancy.  Ectopic pregnancy (pregnancy outside the uterus).  Fetal heartbeat.  Abnormalities in the pelvis, that are not seen well with transabdominal ultrasound.  Suspected twins or multiples.  Impending miscarriage.  Problems with the cervix (incompetent cervix, not able to stay closed and hold the baby).  When doing an amniocentesis (removing fluid from the pregnancy sac, for testing).  Looking for abnormalities of the baby.  Checking the growth, development, and age of the fetus.  Measuring the amount of fluid in the amniotic sac.  When doing an external version of the baby (moving  baby into correct position).  Evaluating the baby for problems in high risk pregnancies (biophysical profile).  Suspected fetal demise (death). Sometimes a special ultrasound method called Saline Infusion Sonography (SIS) is used for a more accurate look at the uterus. Sterile saline (salt water) is injected into the uterus of non-pregnant patients to see the inside of the uterus better. SIS is not used on pregnant women. The vaginal probe can also assist in obtaining biopsies of abnormal areas, in draining fluid from cysts on the ovary, and in finding IUDs (intrauterine device, birth control) that cannot be located. PREPARATION FOR TEST A transvaginal ultrasound is done with the bladder empty. The transabdominal ultrasound is done with your bladder full. You may be asked to drink several glasses of water before that exam. Sometimes, a transabdominal ultrasound is done just after a transvaginal ultrasound, to look at organs in your abdomen. PROCEDURE  You will lie down on a table, with your knees bent and your feet in foot holders. The probe is covered with a condom. A sterile lubricant is put into the vagina and on the probe. The lubricant helps transmit the sound waves and avoid irritating the vagina. Your caregiver will move the probe inside the vaginal cavity to scan the pelvic structures. A normal test will show a normal pelvis and normal contents. An abnormal test will show abnormalities of the pelvis, placenta, or baby. ABNORMAL RESULTS MAY BE DUE TO:  Growths or tumors in the:  Uterus.  Ovaries.  Vagina.  Other pelvic structures.  Non-cancerous growths of the uterus and ovaries.  Twisting of the ovary, cutting off blood supply to the ovary (ovarian torsion).  Areas of infection, including:  Pelvic  inflammatory disease.  Abscess in the pelvis.  Locating an IUD. PROBLEMS FOUND IN PREGNANT WOMEN MAY INCLUDE:  Ectopic pregnancy (pregnancy outside the uterus).  Multiple  pregnancies.  Early dilation (opening) of the cervix. This may indicate an incompetent cervix and early delivery.  Impending miscarriage.  Fetal death.  Problems with the placenta, including:  Placenta has grown over the opening of the womb (placenta previa).  Placenta has separated early in the womb (placental abruption).  Placenta grows into the muscle of the uterus (placenta accreta).  Tumors of pregnancy, including gestational trophoblastic disease. This is an abnormal pregnancy, with no fetus. The uterus is filled with many grape-like cysts that could sometimes be cancerous.  Incorrect position of the fetus (breech, vertex).  Intrauterine fetal growth retardation (IUGR) (poor growth in the womb).  Fetal abnormalities or infection. RISKS AND COMPLICATIONS There are no known risks to the ultrasound procedure. There is no X-ray used when doing an ultrasound. Document Released: 11/16/2004 Document Revised: 02/27/2012 Document Reviewed: 11/04/2009 [facility] Patient Information 2015 [place], [place]. This information is not intended to replace advice given to you by your health care provider. Make sure you discuss any questions you have with your health care provider. Endometrial Biopsy, Care After Refer to this sheet in the next few weeks. These instructions provide you with information on caring for yourself after your procedure. Your health care provider may also give you more specific instructions. Your treatment has been planned according to current medical practices, but problems sometimes occur. Call your health care provider if you have any problems or questions after your procedure. WHAT TO EXPECT AFTER THE PROCEDURE After your procedure, it is typical to have the following:  You may have mild cramping and a small amount of vaginal bleeding for a few days after the procedure. This is normal. HOME CARE INSTRUCTIONS  Only take over-the-counter or prescription medicine as  directed by your health care provider.  Do not douche, use tampons, or have sexual intercourse until your health care provider approves.  Follow your health care provider's instructions regarding any activity restrictions, such as strenuous exercise or heavy lifting. SEEK MEDICAL CARE IF:  You have heavy bleeding or bleeding longer than 2 days after the procedure.  You have bad smelling drainage from your vagina.  You have a fever and chills.  Youhave severe lower stomach (abdominal) pain. SEEK IMMEDIATE MEDICAL CARE IF:  You have severe cramps in your stomach or back.  You pass large blood clots.  Your bleeding increases.  You become weak or lightheaded, or you pass out. Document Released: 09/25/2013 Document Reviewed: 09/25/2013 [facility] Patient Information 2015 [place], [place]. This information is not intended to replace advice given to you by your health care provider. Make sure you discuss any questions you have with your health care provider. Endometrial Biopsy Endometrial biopsy is a procedure in which a tissue sample is taken from inside the uterus. The tissue sample is then looked at under a microscope to see if the tissue is normal or abnormal. The endometrium is the lining of the uterus. This procedure helps determine where you are in your menstrual cycle and how hormone levels are affecting the lining of the uterus. This procedure may also be used to evaluate uterine bleeding or to diagnose endometrial cancer, tuberculosis, polyps, or inflammatory conditions.  LET [facility] CARE PROVIDER KNOW ABOUT:  Any allergies you have.  All medicines you are taking, including vitamins, herbs, eye drops, creams, and over-the-counter medicines.  Previous problems  you or members of your family have had with the use of anesthetics.  Any blood disorders you have.  Previous surgeries you have had.  Medical conditions you have.  Possibility of pregnancy. RISKS AND  COMPLICATIONS Generally, this is a safe procedure. However, as with any procedure, complications can occur. Possible complications include:  Bleeding.  Pelvic infection.  Puncture of the uterine wall with the biopsy device (rare). BEFORE THE PROCEDURE   Keep a record of your menstrual cycles as directed by your health care provider. You may need to schedule your procedure for a specific time in your cycle.  You may want to bring a sanitary pad to wear home after the procedure.  Arrange for someone to drive you home after the procedure if you will be given a medicine to help you relax (sedative). PROCEDURE   You may be given a sedative to relax you.  You will lie on an exam table with your feet and legs supported as in a pelvic exam.  Your health care provider will insert an instrument (speculum) into your vagina to see your cervix.  Your cervix will be cleansed with an antiseptic solution. A medicine (local anesthetic) will be used to numb the cervix.  A forceps instrument (tenaculum) will be used to hold your cervix steady for the biopsy.  A thin, rodlike instrument (uterine sound) will be inserted through your cervix to determine the length of your uterus and the location where the biopsy sample will be removed.  A thin, flexible tube (catheter) will be inserted through your cervix and into the uterus. The catheter is used to collect the biopsy sample from your endometrial tissue.  The catheter and speculum will then be removed, and the tissue sample will be sent to a lab for examination. AFTER THE PROCEDURE  You will rest in a recovery area until you are ready to go home.  You may have mild cramping and a small amount of vaginal bleeding for a few days after the procedure. This is normal.  Make sure you find out how to get your test results. Document Released: 04/07/2005 Document Revised: 08/07/2013 Document Reviewed: 05/22/2013 [facility] Patient Information 2015  [place], [place]. This information is not intended to replace advice given to you by your health care provider. Make sure you discuss any questions you have with your health care provider.

## 2015-02-05 NOTE — Progress Notes (Signed)
Sandy White 04-27-1961 [id number]   History:    54 y.o.  for annual gyn exam who is essentially a new patient to the practice she has not been seen since 2012. She is here not only for gynecological examination which was long overdue but as a result of multiple problems as follows:  Patient has been followed by her primary care physician in [facility] Dr. Willey Blade. Patient has not seen him in over a year. Patient states that her hemoglobin has fluctuated from 8-13 g and it was 9 g approximate 2 weeks ago. He had put her on high doses of iron tablet bedded messed her stomach. Patient complains of dysmenorrhea. She has never been on any hormone replacement therapy. She denies any vasomotor symptoms.  Patient also had been followed by the urologist for interstitial cystitis and it was concern whether recently she had microscopic hematuria and salty urologist on February 16 and had a negative cystoscopy and review of the report had stated that an ultrasound had been done and there was a question of a right ovarian cysts and a small fibroid on her uterus. She had been placed on Elmeron in the past but took it only for a few years cannot tolerate the side effects. (Dr. Junious Silk. She is currently taking Vesicare for overactive bladder.  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  Patient's mother, sister and grandmother had breast cancer. Her mother died of breast cancer. She has never been tested for the BRCA one BRCA2 which was offered several years ago.  Patient also has history of hypothyroidism for which she is on Synthroid 10 g daily.   She has not had a colonoscopy yet.  Ultrasound reviewed from 10/12/2011 had demonstrated she had a uterus that measured 10.6 x 7.8 x 6.1 cm with endometrial stripe 11.3 mm and she had several intramural myomas the largest one measuring 21 x 15 mm and was calcified. They  described prominent endometrial cavity with 2 echogenic foci 18 x 9 mm, and 7 x 11 mm with color flow to the area. She had a right corpus luteum cyst left ovary was normal.  Past medical history,surgical history, family history and social history were all reviewed and documented in the EPIC chart.  Gynecologic History Patient's last menstrual period was 01/18/2015. Contraception: tubal ligation Last Pap: Several years ago. Results were: normal Last mammogram: 2015. Results were: normal  Obstetric History OB History  Gravida Para Term Preterm AB SAB TAB Ectopic Multiple Living  '4 4 4       4    ' # Outcome Date GA Lbr Len/2nd Weight Sex Delivery Anes PTL Lv  4 Term           3 Term           2 Term           1 Term                ROS: A ROS was performed and pertinent positives and negatives are included in the history.  GENERAL: No fevers or chills. HEENT: No change in vision, no earache, sore throat or sinus congestion. NECK: No pain or stiffness. CARDIOVASCULAR: No chest pain or pressure. No palpitations. PULMONARY: No shortness of breath, cough or wheeze. GASTROINTESTINAL: No abdominal pain, nausea, vomiting or diarrhea, melena or bright red blood per rectum. GENITOURINARY: No urinary frequency, urgency, hesitancy or  dysuria. MUSCULOSKELETAL: No joint or muscle pain, no back pain, no recent trauma. DERMATOLOGIC: No rash, no itching, no lesions. ENDOCRINE: No polyuria, polydipsia, no heat or cold intolerance. No recent change in weight. HEMATOLOGICAL: No anemia or easy bruising or bleeding. NEUROLOGIC: No headache, seizures, numbness, tingling or weakness. PSYCHIATRIC: No depression, no loss of interest in normal activity or change in sleep pattern.     Exam: chaperone present  BP 132/86 mmHg  Ht '5\' 3"'  (1.6 m)  Wt 142 lb (64.411 kg)  BMI 25.16 kg/m2  LMP 01/18/2015  Body mass index is 25.16 kg/(m^2).  General appearance : Well developed well nourished female. No acute  distress HEENT: Neck supple, trachea midline, no carotid bruits, no thyroidmegaly Lungs: Clear to auscultation, no rhonchi or wheezes, or rib retractions  Heart: Regular rate and rhythm, no murmurs or gallops Breast:Examined in sitting and supine position were symmetrical in appearance, no palpable masses or tenderness,  no skin retraction, no nipple inversion, no nipple discharge, no skin discoloration, no axillary or supraclavicular lymphadenopathy Abdomen: no palpable masses or tenderness, no rebound or guarding Extremities: no edema or skin discoloration or tenderness  Pelvic:  Bartholin, Urethra, Skene Glands: Within normal limits             Vagina: No gross lesions or discharge  Cervix: No gross lesions or discharge  Uterus  anteverted irregular shaped, normal size, shape and consistency, non-tender and mobile  Adnexa  Without masses or tenderness  Anus and perineum  normal   Rectovaginal  normal sphincter tone without palpated masses or tenderness             Hemoccult needs colonoscopy     Assessment/Plan:  54 y.o. female for annual exam with multiple medical issues. 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 The patient was counseled and the cervix was cleansed with Betadine solution and an endometrial biopsy was obtained after Pap smear with HPV screen performed. A CBC, screening cholesterol, compresses a metabolic panel, and TSH was obtained today. Patient will return back to the office in 1-2 weeks for sonohysterogram for further evaluation of her metromenorrhagia/perimenopausal. Also to follow-up on the right ovarian cyst seen at the urologist office at time of ultrasound. Also to monitor the size of her uterine fibroids. Her urologist will continue to monitor and treat her for her overactive bladder and interstitial cystitis. Patient is reminded to do her monthly breast exam. She also needs to schedule colonoscopy.  Terrance Mass MD, 3:15 PM 02/05/2015

## 2015-02-06 ENCOUNTER — Other Ambulatory Visit: Payer: Self-pay | Admitting: Gynecology

## 2015-02-06 DIAGNOSIS — N924 Excessive bleeding in the premenopausal period: Secondary | ICD-10-CM

## 2015-02-06 DIAGNOSIS — R7989 Other specified abnormal findings of blood chemistry: Secondary | ICD-10-CM

## 2015-02-06 DIAGNOSIS — N83201 Unspecified ovarian cyst, right side: Secondary | ICD-10-CM

## 2015-02-06 DIAGNOSIS — D251 Intramural leiomyoma of uterus: Secondary | ICD-10-CM

## 2015-02-06 DIAGNOSIS — N946 Dysmenorrhea, unspecified: Secondary | ICD-10-CM

## 2015-02-06 DIAGNOSIS — R945 Abnormal results of liver function studies: Principal | ICD-10-CM

## 2015-02-06 DIAGNOSIS — E7800 Pure hypercholesterolemia, unspecified: Secondary | ICD-10-CM

## 2015-02-09 LAB — CYTOLOGY - PAP

## 2015-02-16 ENCOUNTER — Telehealth: Payer: Self-pay | Admitting: Gynecology

## 2015-02-16 NOTE — Telephone Encounter (Signed)
02/16/15-Pt was informed today that her [facility] insurance covers the [facility] under her $70.00 copay.wl

## 2015-02-20 ENCOUNTER — Encounter: Payer: Self-pay | Admitting: Gynecology

## 2015-02-20 ENCOUNTER — Ambulatory Visit (INDEPENDENT_AMBULATORY_CARE_PROVIDER_SITE_OTHER): Payer: BC Managed Care – PPO

## 2015-02-20 ENCOUNTER — Other Ambulatory Visit: Payer: BC Managed Care – PPO

## 2015-02-20 ENCOUNTER — Ambulatory Visit (INDEPENDENT_AMBULATORY_CARE_PROVIDER_SITE_OTHER): Payer: BC Managed Care – PPO | Admitting: Gynecology

## 2015-02-20 DIAGNOSIS — N8320 Unspecified ovarian cysts: Secondary | ICD-10-CM | POA: Diagnosis not present

## 2015-02-20 DIAGNOSIS — N951 Menopausal and female climacteric states: Secondary | ICD-10-CM

## 2015-02-20 DIAGNOSIS — R7989 Other specified abnormal findings of blood chemistry: Secondary | ICD-10-CM

## 2015-02-20 DIAGNOSIS — N938 Other specified abnormal uterine and vaginal bleeding: Secondary | ICD-10-CM | POA: Diagnosis not present

## 2015-02-20 DIAGNOSIS — R748 Abnormal levels of other serum enzymes: Secondary | ICD-10-CM | POA: Diagnosis not present

## 2015-02-20 DIAGNOSIS — E7800 Pure hypercholesterolemia, unspecified: Secondary | ICD-10-CM

## 2015-02-20 DIAGNOSIS — R945 Abnormal results of liver function studies: Secondary | ICD-10-CM

## 2015-02-20 DIAGNOSIS — N924 Excessive bleeding in the premenopausal period: Secondary | ICD-10-CM | POA: Diagnosis not present

## 2015-02-20 DIAGNOSIS — N946 Dysmenorrhea, unspecified: Secondary | ICD-10-CM

## 2015-02-20 DIAGNOSIS — N83201 Unspecified ovarian cyst, right side: Secondary | ICD-10-CM

## 2015-02-20 DIAGNOSIS — D251 Intramural leiomyoma of uterus: Secondary | ICD-10-CM | POA: Diagnosis not present

## 2015-02-20 LAB — LIPID PANEL
Cholesterol: 208 mg/dL — ABNORMAL HIGH (ref 0–200)
HDL: 37 mg/dL — ABNORMAL LOW
LDL Cholesterol: 139 mg/dL — ABNORMAL HIGH (ref 0–99)
Total CHOL/HDL Ratio: 5.6 ratio
Triglycerides: 161 mg/dL — ABNORMAL HIGH
VLDL: 32 mg/dL (ref 0–40)

## 2015-02-20 LAB — ALT: ALT: 26 U/L (ref 0–35)

## 2015-02-20 LAB — AST: AST: 26 U/L (ref 0–37)

## 2015-02-20 LAB — HEPATITIS PANEL, ACUTE
HCV Ab: NEGATIVE
Hep A IgM: NONREACTIVE
Hep B C IgM: NONREACTIVE
Hepatitis B Surface Ag: NEGATIVE

## 2015-02-20 LAB — ALKALINE PHOSPHATASE: Alkaline Phosphatase: 121 U/L — ABNORMAL HIGH (ref 39–117)

## 2015-02-20 NOTE — Progress Notes (Addendum)
   Patient is a 54 year old who was seen in the office as a new patient to the practice on 02/05/2015. Patient had been complaining of irregular perimenopausal bleeding. She had reported that her primary physician had treated her in the past for iron deficiency anemia. She is also being followed by the urologist for interstitial cystitis and also she is being followed by [facility] department of dermatology for her skin cancer. She is here for sonohysterogram as well as to discuss her past endometrial biopsy and blood test.  On the last office visit of 02/05/2015 she had an ultrasound which demonstrated the following:  Ultrasound reviewed from 10/12/2011 had demonstrated she had a uterus that measured 10.6 x 7.8 x 6.1 cm with endometrial stripe 11.3 mm and she had several intramural myomas the largest one measuring 21 x 15 mm and was calcified. They described prominent endometrial cavity with 2 echogenic foci 18 x 9 mm, and 7 x 11 mm with color flow to the area. She had a right corpus luteum cyst left ovary was normal.  She also had an endometrial biopsy which demonstrated the following: Diagnosis Endometrium, biopsy, uterus - BENIGN ENDOMETRIAL POLYP IN A SETTING OF PROLIFERATIVE ENDOMETRIUM. - NO ENDOMETRIAL HYPERPLASIA, ATYPIA OR MALIGNANCY IDENTIFIED.  Her recent comprehensive metabolic panel demonstrated that her alkaline phosphatase was elevated with a value 151 and her SGPT was slightly elevated at 36. She had a normal CBC,, her Pap smear was also normal. Screening cholesterol 203.   Patient was counseled for sonohysterogram. Procedure is follows: The cervix was cleansed with Betadine solution. Her cervix was stenotic. A CO2 tenaculum was placed on the anterior cervical lip and serial dilators were utilized to dilate the stenotic cervix in an effort to allow passage of the intrauterine catheter. Normal saline was then instilled into the uterine cavity and no intracavitary  abnormalities was noted. She was found to have 5 fibroids the largest one measuring 19 x 19 x 25 mm ovaries appear to be normal. Endometrial stripe was 7.5 mm.  Assessment/plan: #1 perimenopausal patient with irregular bleeding occasional vasomotor symptoms we'll check FSH today and she will monitor with a calendar for the next 6 months and return to the office for follow-up. Normal endometrial biopsy with the exception of a small benign polyp removed normal sonohysterogram. #2 small intramural fibroids #3 incidental finding of elevated alkaline phosphatase along with elevated screening cholesterol. Patient will have repeat SGOT and SGPT as well as alkaline phosphatase and hepatitis panel today. We discussed several clinical entities that may cause elevated alkaline phosphatase. She also needs a screening colonoscopy. She will be referred to the gastroenterologist for screening colonoscopy and we will notify her as well as the gastroenterologist if her repeat labs are abnormal for further evaluation.

## 2015-02-21 LAB — FOLLICLE STIMULATING HORMONE: FSH: 69.4 m[IU]/mL

## 2015-02-23 ENCOUNTER — Other Ambulatory Visit: Payer: Self-pay | Admitting: Gynecology

## 2015-02-23 DIAGNOSIS — R945 Abnormal results of liver function studies: Principal | ICD-10-CM

## 2015-02-23 DIAGNOSIS — R7989 Other specified abnormal findings of blood chemistry: Secondary | ICD-10-CM

## 2015-02-23 DIAGNOSIS — E7800 Pure hypercholesterolemia, unspecified: Secondary | ICD-10-CM

## 2015-03-26 ENCOUNTER — Other Ambulatory Visit: Payer: Self-pay

## 2015-04-02 ENCOUNTER — Other Ambulatory Visit: Payer: BC Managed Care – PPO

## 2015-04-02 DIAGNOSIS — E7800 Pure hypercholesterolemia, unspecified: Secondary | ICD-10-CM

## 2015-04-02 DIAGNOSIS — R7989 Other specified abnormal findings of blood chemistry: Secondary | ICD-10-CM

## 2015-04-02 DIAGNOSIS — R945 Abnormal results of liver function studies: Principal | ICD-10-CM

## 2015-04-02 LAB — LIPID PANEL
Cholesterol: 201 mg/dL — ABNORMAL HIGH (ref 0–200)
HDL: 40 mg/dL — ABNORMAL LOW (ref >=46)
LDL Cholesterol: 132 mg/dL — ABNORMAL HIGH (ref 0–99)
Total CHOL/HDL Ratio: 5 Ratio
Triglycerides: 147 mg/dL (ref <150)
VLDL: 29 mg/dL (ref 0–40)

## 2015-04-02 LAB — ALKALINE PHOSPHATASE: ALK PHOS: 97 U/L (ref 39–117)

## 2015-04-03 ENCOUNTER — Other Ambulatory Visit: Payer: Self-pay | Admitting: Gynecology

## 2015-04-03 DIAGNOSIS — E7800 Pure hypercholesterolemia, unspecified: Secondary | ICD-10-CM

## 2015-07-23 ENCOUNTER — Other Ambulatory Visit: Payer: BC Managed Care – PPO

## 2016-11-22 ENCOUNTER — Other Ambulatory Visit (HOSPITAL_COMMUNITY): Payer: Self-pay | Admitting: Internal Medicine

## 2016-11-22 DIAGNOSIS — R1011 Right upper quadrant pain: Secondary | ICD-10-CM

## 2016-11-25 ENCOUNTER — Ambulatory Visit (HOSPITAL_COMMUNITY)
Admission: RE | Admit: 2016-11-25 | Discharge: 2016-11-25 | Disposition: A | Discharge status: Home / Self Care | Payer: BC Managed Care – PPO | Source: Ambulatory Visit | Attending: Internal Medicine | Admitting: Internal Medicine

## 2016-11-25 DIAGNOSIS — R1011 Right upper quadrant pain: Secondary | ICD-10-CM | POA: Diagnosis present

## 2016-11-25 DIAGNOSIS — K7689 Other specified diseases of liver: Secondary | ICD-10-CM | POA: Diagnosis not present

## 2016-11-28 ENCOUNTER — Other Ambulatory Visit (HOSPITAL_COMMUNITY): Payer: Self-pay | Admitting: Internal Medicine

## 2016-11-28 DIAGNOSIS — R1011 Right upper quadrant pain: Secondary | ICD-10-CM

## 2016-11-28 DIAGNOSIS — R110 Nausea: Secondary | ICD-10-CM

## 2016-11-30 ENCOUNTER — Encounter (HOSPITAL_COMMUNITY): Payer: BC Managed Care – PPO

## 2017-01-31 ENCOUNTER — Ambulatory Visit (INDEPENDENT_AMBULATORY_CARE_PROVIDER_SITE_OTHER): Payer: BC Managed Care – PPO | Admitting: Gynecology

## 2017-01-31 ENCOUNTER — Encounter: Payer: Self-pay | Admitting: Gynecology

## 2017-01-31 VITALS — BP 132/80 | Ht 63.0 in | Wt 136.0 lb

## 2017-01-31 DIAGNOSIS — D500 Iron deficiency anemia secondary to blood loss (chronic): Secondary | ICD-10-CM

## 2017-01-31 DIAGNOSIS — N950 Postmenopausal bleeding: Secondary | ICD-10-CM

## 2017-01-31 DIAGNOSIS — D251 Intramural leiomyoma of uterus: Secondary | ICD-10-CM | POA: Diagnosis not present

## 2017-01-31 MED ORDER — MEGESTROL ACETATE 40 MG PO TABS: 40.0000 mg | ORAL_TABLET | Freq: Two times a day (BID) | ORAL | 1 refills | Status: DC

## 2017-01-31 NOTE — Addendum Note (Signed)
Addended by: Burnett Kanaris on: 01/31/2017 01:05 PM   Modules accepted: Orders

## 2017-01-31 NOTE — Patient Instructions (Signed)
ENDOMETRIAL BIOPSY POST-PROCEDURE INSTRUCTIONS  1. You may take Ibuprofen, Aleve or Tylenol for pain if needed.  Cramping should resolve within in 24 hours.  2. You may have a small amount of spotting.  You should wear a mini pad for the next few days.  3. You may have intercourse after 24 hours.  4. You need to call if you have any pelvic pain, fever, heavy bleeding or foul smelling vaginal discharge.  5. Shower or bathe as normal  6. We will call you within one week with results or we will discuss   the results at your follow-up appointment if needed.  Uterine Fibroids Uterine fibroids are tissue masses (tumors) that can develop in the womb (uterus). They are also called leiomyomas. This type of tumor is not cancerous (benign) and does not spread to other parts of the body outside of the pelvic area, which is between the hip bones. Occasionally, fibroids may develop in the fallopian tubes, in the cervix, or on the support structures (ligaments) that surround the uterus. You can have one or many fibroids. Fibroids can vary in size, weight, and where they grow in the uterus. Some can become quite large. Most fibroids do not require medical treatment. What are the causes? A fibroid can develop when a single uterine cell keeps growing (replicating). Most cells in the human body have a control mechanism that keeps them from replicating without control. What are the signs or symptoms? Symptoms may include:  Heavy bleeding during your period.  Bleeding or spotting between periods.  Pelvic pain and pressure.  Bladder problems, such as needing to urinate more often (urinary frequency) or urgently.  Inability to reproduce offspring (infertility).  Miscarriages. How is this diagnosed? Uterine fibroids are diagnosed through a physical exam. Your health care provider may feel the lumpy tumors during a pelvic exam. Ultrasonography and an MRI may be done to determine the size, location, and  number of fibroids. How is this treated? Treatment may include:  Watchful waiting. This involves getting the fibroid checked by your health care provider to see if it grows or shrinks. Follow your health care provider's recommendations for how often to have this checked.  Hormone medicines. These can be taken by mouth or given through an intrauterine device (IUD).  Surgery.  Removing the fibroids (myomectomy) or the uterus (hysterectomy).  Removing blood supply to the fibroids (uterine artery embolization). If fibroids interfere with your fertility and you want to become pregnant, your health care provider may recommend having the fibroids removed. Follow these instructions at home:  Keep all follow-up visits as directed by your health care provider. This is important.  Take over-the-counter and prescription medicines only as told by your health care provider.  If you were prescribed a hormone treatment, take the hormone medicines exactly as directed.  Ask your health care provider about taking iron pills and increasing the amount of dark green, leafy vegetables in your diet. These actions can help to boost your blood iron levels, which may be affected by heavy menstrual bleeding.  Pay close attention to your period and tell your health care provider about any changes, such as:  Increased blood flow that requires you to use more pads or tampons than usual per month.  A change in the number of days that your period lasts per month.  A change in symptoms that are associated with your period, such as abdominal cramping or back pain. Contact a health care provider if:  You have pelvic  pain, back pain, or abdominal cramps that cannot be controlled with medicines.  You have an increase in bleeding between and during periods.  You soak tampons or pads in a half hour or less.  You feel lightheaded, extra tired, or weak. Get help right away if:  You faint.  You have a sudden  increase in pelvic pain. This information is not intended to replace advice given to you by your health care provider. Make sure you discuss any questions you have with your health care provider. Document Released: 12/02/2000 Document Revised: 08/04/2016 Document Reviewed: 06/03/2014 Elsevier Interactive Patient Education  2017 [employer].

## 2017-01-31 NOTE — Addendum Note (Signed)
Addended by: Terrance Mass on: 01/31/2017 12:27 PM   Modules accepted: Orders

## 2017-01-31 NOTE — Progress Notes (Signed)
   Patient is a 56 year old who was last seen in the office in March 2016. Patient with irregular perimenopausal bleeding during that time. She had an ultrasound 02/05/2015 which demonstrated the following:  Ultrasound reviewed from 10/12/2011 had demonstrated she had a uterus that measured 10.6 x 7.8 x 6.1 cm with endometrial stripe 11.3 mm and she had several intramural myomas the largest one measuring 21 x 15 mm and was calcified. They described prominent endometrial cavity with 2 echogenic foci 18 x 9 mm, and 7 x 11 mm with color flow to the area. She had a right corpus luteum cyst left ovary was normal.  Endometrial biopsy done at that office visit demonstrated the following:  She also had an endometrial biopsy which demonstrated the following: Diagnosis Endometrium, biopsy, uterus - BENIGN ENDOMETRIAL POLYP IN A SETTING OF PROLIFERATIVE ENDOMETRIUM. - NO ENDOMETRIAL HYPERPLASIA, ATYPIA OR MALIGNANCY IDENTIFIED.  Sonohysterogram shortly thereafter demonstrated no intracavitary uterine defect that she was asked to monitor her menstrual cycle over course of 6 months and return to the office for follow-up which she did not. Also during that year annual exam had demonstrated that her [facility] was in the menopausal range of 69.4 and alkaline phosphatase was elevated on repeat was back to normal. Her last Pap was in 2016. She recently had a mammogram within 12 months which was normal.  Patient reports that she will go 2-3 months without a cycle start rebleeding again and recently she has been bleeding steadily for the past 2-1/2 months. She stated she saw her PCP was recently did her blood work and informed that she was probably anemic and we'll be getting a call from him today as to how is going to treat it. We have no information the present time  Exam: Patient slightly pale in appearance but no acute distress except the vaginal bleeding stable vital signs Abdomen: Soft nontender no rebound or  guarding Pelvic: Bartholin urethra Skene was within normal limits Vagina menstrual blood present Cervix: Blood clot at the cervical os Pap smear obtained Uterus: 12-14 week size irregular nontender mobile Adnexa: No gross palpable masses or tenderness Rectal exam not done  After Pap smear was obtained patient was counseled for an endometrial biopsy. The cervix was cleansed with Betadine solution. A single-tooth tenaculum was placed on the anterior cervical lip for traction. This uterus sounded to 10 cm moderate amount of blood and tissue was obtained submitted for histological evaluation.  Assessment/plan: Postmenopausal patient with bleeding underwent an endometrial biopsy today result pending at time of this dictation. A Pap smear was also done. Patient to receive contact from her PCP who recently did her CBC that she may begin taking iron supplementation. In the meantime I'm going to start her on Megace 40 mg twice a day for the next 2 weeks to stop her bleeding. She is going to return back to the office next week for sonohysterogram. After the sonohysterogram and after the results of the biopsy we'll plan a course of management. We discussed potential treatment options to include endometrial ablation, placement of Mirena IUD, and hysterectomy.

## 2017-02-01 ENCOUNTER — Encounter (HOSPITAL_COMMUNITY)
Admission: RE | Admit: 2017-02-01 | Discharge: 2017-02-01 | Disposition: A | Discharge status: Home / Self Care | Payer: BC Managed Care – PPO | Source: Ambulatory Visit | Attending: Internal Medicine | Admitting: Internal Medicine

## 2017-02-01 ENCOUNTER — Encounter (HOSPITAL_COMMUNITY): Payer: Self-pay

## 2017-02-01 DIAGNOSIS — D649 Anemia, unspecified: Secondary | ICD-10-CM | POA: Diagnosis present

## 2017-02-01 LAB — ABO/RH: ABO/RH(D): O POS

## 2017-02-01 LAB — HEMOGLOBIN AND HEMATOCRIT, BLOOD
HCT: 23.5 % — ABNORMAL LOW (ref 36.0–46.0)
HEMOGLOBIN: 6.8 g/dL — AB (ref 12.0–15.0)

## 2017-02-01 LAB — PREPARE RBC (CROSSMATCH)

## 2017-02-01 NOTE — Discharge Instructions (Signed)
Blood Transfusion A blood transfusion is a procedure in which you are given blood through an IV tube. You may need this procedure because of:  Illness.  Surgery.  Injury.  The blood may come from someone else (a donor). You may also be able to donate blood for yourself (autologous blood donation). The blood given in a transfusion is made up of different types of cells. You may get:  Red blood cells. These carry oxygen to the cells in the body.  White blood cells. These help you fight infections.  Platelets. These help your blood to clot.  Plasma. This is the liquid part of your blood. It helps with fluid imbalances.  If you have a clotting disorder, you may also get other types of blood products. What happens before the procedure?  You will have a blood test to find out your blood type. The test also finds out what type of blood your body will accept and matches it to the donor type.  If you are going to have a planned surgery, you may be able to donate your own blood. This may be done in case you need a transfusion.  If you have had an allergic reaction to a transfusion in the past, you may be given medicine to help prevent a reaction. This medicine may be given to you by mouth or through an IV.  You will have your temperature, blood pressure, and pulse checked.  Follow instructions from your doctor about what you cannot eat or drink.  Ask your doctor about: ? Changing or stopping your regular medicines. This is important if you take diabetes medicines or blood thinners. ? Taking medicines such as aspirin and ibuprofen. These medicines can thin your blood. Do not take these medicines before your procedure if your doctor tells you not to. What happens during the procedure?  An IV tube will be put into one of your veins.  The bag of donated blood will be attached to your IV tube. Then, the blood will enter through your vein.  Your temperature, blood pressure, and pulse will be  checked regularly during the procedure. This is done to find early signs of a transfusion reaction.  If you have any signs or symptoms of a reaction, your transfusion will be stopped. You may also be given medicine.  When the transfusion is done, your IV tube will be taken out.  Pressure may be applied to the IV site for a few minutes.  A bandage (dressing) will be put on the IV site. The procedure may vary among doctors and hospitals. What happens after the procedure?  Your temperature, blood pressure, heart rate, breathing rate, and blood oxygen level will be checked often.  Your blood may be tested to see how you are responding to the transfusion.  You may be warmed with fluids or blankets. This is done to keep the temperature of your body normal. Summary  A blood transfusion is a procedure in which you are given blood through an IV tube.  The blood may come from someone else (a donor). You may also be able to donate blood for yourself.  If you have had an allergic reaction to a transfusion in the past, you may be given medicine to help prevent a reaction. This medicine may be given to you by mouth or through an IV tube.  Your temperature, blood pressure, heart rate, breathing rate, and blood oxygen level will be checked often.  Your blood may be tested to   see how you are responding to the transfusion. This information is not intended to replace advice given to you by your health care provider. Make sure you discuss any questions you have with your health care provider. Document Released: 03/03/2009 Document Revised: 07/29/2016 Document Reviewed: 07/29/2016 Elsevier Interactive Patient Education  2017 [employer]  

## 2017-02-01 NOTE — Progress Notes (Signed)
Results for Sandy White, Sandy White (MRN [id number]) as of 02/01/2017 13:50 Patient is scheduled for blood transfusion on 02/02/2017 @ 0800. Consent and attestation obtained.   Ref. Range 02/01/2017 13:14  Hemoglobin Latest Ref Range: 12.0 - 15.0 g/dL 6.8 (LL)  HCT Latest Ref Range: 36.0 - 46.0 % 23.5 (L)

## 2017-02-02 ENCOUNTER — Encounter (HOSPITAL_COMMUNITY)
Admission: RE | Admit: 2017-02-02 | Discharge: 2017-02-02 | Disposition: A | Discharge status: Home / Self Care | Payer: BC Managed Care – PPO | Source: Ambulatory Visit | Attending: Internal Medicine | Admitting: Internal Medicine

## 2017-02-02 ENCOUNTER — Other Ambulatory Visit: Payer: Self-pay | Admitting: Gynecology

## 2017-02-02 DIAGNOSIS — D649 Anemia, unspecified: Secondary | ICD-10-CM | POA: Diagnosis not present

## 2017-02-02 DIAGNOSIS — N950 Postmenopausal bleeding: Secondary | ICD-10-CM

## 2017-02-02 LAB — HEMOGLOBIN AND HEMATOCRIT, BLOOD
HCT: 29.2 % — ABNORMAL LOW (ref 36.0–46.0)
Hemoglobin: 9 g/dL — ABNORMAL LOW (ref 12.0–15.0)

## 2017-02-02 LAB — PAP IG W/ RFLX HPV ASCU

## 2017-02-02 MED ORDER — SODIUM CHLORIDE 0.9 % IV SOLN
Freq: Once | INTRAVENOUS | Status: AC
  Administered 2017-02-02: 250 mL via INTRAVENOUS

## 2017-02-03 LAB — TYPE AND SCREEN
ABO/RH(D): O POS
ANTIBODY SCREEN: NEGATIVE
UNIT DIVISION: 0
Unit division: 0

## 2017-02-03 LAB — HUMAN PAPILLOMAVIRUS, HIGH RISK: HPV DNA High Risk: NOT DETECTED

## 2017-02-03 NOTE — Final Progress Note (Signed)
Results for Sandy, White (MRN [id number]) as of 02/03/2017 08:11  Ref. Range 02/02/2017 14:00  Hemoglobin Latest Ref Range: 12.0 - 15.0 g/dL 9.0 (L)  HCT Latest Ref Range: 36.0 - 46.0 % 29.2 (L)   S/p transfusion H&H

## 2017-02-06 ENCOUNTER — Ambulatory Visit (INDEPENDENT_AMBULATORY_CARE_PROVIDER_SITE_OTHER): Payer: BC Managed Care – PPO

## 2017-02-06 ENCOUNTER — Ambulatory Visit (INDEPENDENT_AMBULATORY_CARE_PROVIDER_SITE_OTHER): Payer: BC Managed Care – PPO | Admitting: Gynecology

## 2017-02-06 ENCOUNTER — Other Ambulatory Visit: Payer: Self-pay | Admitting: Gynecology

## 2017-02-06 DIAGNOSIS — R9389 Abnormal findings on diagnostic imaging of other specified body structures: Secondary | ICD-10-CM

## 2017-02-06 DIAGNOSIS — D251 Intramural leiomyoma of uterus: Secondary | ICD-10-CM | POA: Diagnosis not present

## 2017-02-06 DIAGNOSIS — N951 Menopausal and female climacteric states: Secondary | ICD-10-CM | POA: Diagnosis not present

## 2017-02-06 DIAGNOSIS — N938 Other specified abnormal uterine and vaginal bleeding: Secondary | ICD-10-CM | POA: Insufficient documentation

## 2017-02-06 DIAGNOSIS — R938 Abnormal findings on diagnostic imaging of other specified body structures: Secondary | ICD-10-CM

## 2017-02-06 DIAGNOSIS — N950 Postmenopausal bleeding: Secondary | ICD-10-CM

## 2017-02-06 DIAGNOSIS — D500 Iron deficiency anemia secondary to blood loss (chronic): Secondary | ICD-10-CM | POA: Diagnosis not present

## 2017-02-06 DIAGNOSIS — N83202 Unspecified ovarian cyst, left side: Secondary | ICD-10-CM

## 2017-02-06 NOTE — Progress Notes (Signed)
Patient is a 56 year old gravida 4 para 4 (first 3 pregnancies were vaginal delivery the fourth one was a cesarean section along with tubal ligation). who is diamond seen the office since 2016. Patient is here for evaluation with sonohysterogram and endometrial biopsy as a result of her irregular vaginal bleeding. Patient with past history of iron deficiency anemia her PCP Center to the hospital last week she was transfused 2 units of packed red blood cells due to the fact her hemoglobin was 6.8 upon completion of transfusion her hemoglobin was 9.0 she's currently on iron sulfate one tablet twice a day. She called the office side called in a prescription for Megace 40 mg twice a day to help stop her bleeding to the evaluation today.  Patient had an ultrasound 2016 which demonstrated the following: Ultrasound reviewed from 10/12/2011 had demonstrated she had a uterus that measured 10.6 x 7.8 x 6.1 cm with endometrial stripe 11.3 mm and she had several intramural myomas the largest one measuring 21 x 15 mm and was calcified. They described prominent endometrial cavity with 2 echogenic foci 18 x 9 mm, and 7 x 11 mm with color flow to the area. She had a right corpus luteum cyst left ovary was normal.   Patient also had an endometrial biopsy which demonstrated the following: Diagnosis Endometrium, biopsy, uterus - BENIGN ENDOMETRIAL POLYP IN A SETTING OF PROLIFERATIVE ENDOMETRIUM. - NO ENDOMETRIAL HYPERPLASIA, ATYPIA OR MALIGNANCY IDENTIFIED.  That same office visit in 2016 she had a sonohysterogram and no intracavitary defects she was once again noted to have several fibroids the largest one measuring 19 x 19 x 25 mm the ovaries were normal. Endometrial stripe and was 7.5 mm.   Ultrasound/sono hysterogram today: Uterus measured 11.1 x 7.8 x 6.7 cm patient with 5 fibroids the largest one measuring 20 x 28 mm. Right ovary paraovarian cyst measuring 14 x 16 mm. Left ovary a thin-walled echo-free cyst  measuring 36 x 21 x 31 mm. Thickened endometrium with an echogenic focus measuring 23 x 14 mm was noted negative color flow. Questionable arcuate uterus although by CT scan a few years ago there was no report of this. The cervix is then cleansed with Betadine solution a sterile catheter was introduced into the uterine cavity to endometrial polyps were noted one measuring 23 x 29 x 12 mm, the second 1 19 x 15 mm was noted.  Assessment/plan: Patient with perimenopausal dysfunctional uterine bleeding and severe anemia were by her PCP Center to the hospital last week and she was transfused 2 units of packed red blood cell. Patient to continue her iron supplementation 1 twice a day as well as Megace 40 mg twice a day until a time of her planned surgery. She is going to be scheduled the next couple weeks for resectoscopic polypectomy and placement of Mirena IUD. Patient going out of town the end of March we can monitor her for the next 3 months that she continues to get her iron level back to normal. If she has no further bleeding after the above mentioned procedure and with a Mirena IUD she will not need any hysterectomy. She continues with irregular bleeding the next step would be to consider a laparoscopic-assisted vaginal hysterectomy with bilateral salpingo-oophorectomy for bilateral salpingectomy if she was to retain her ovaries. We are going to check her [place] level today. Literature information was provided we will see her the week before surgery a simple thinwall left ovarian cyst measuring 36 x 21 x  31 mm was noted. We will check a CA 125. If she responds well to the above-mentioned treatment she'll return back to the office in 3 months for an ultrasound to see the been resolution of the ovarian cyst since she's doing.

## 2017-02-06 NOTE — Patient Instructions (Addendum)
CA-125 Tumor Marker Test Why am I having this test? This test is used to check the level of cancer antigen 125 (CA-125) in your blood. The CA-125 tumor marker test can be helpful in detecting ovarian cancer. The test is only performed if you are considered at high risk for ovarian cancer. Your health care provider may recommend this test if:  You have a strong family history of ovarian cancer.  You have a breast cancer antigen (BRCA) genetic defect. If you have already been diagnosed with ovarian cancer, your health care provider may use this test to help identify the extent of the disease and to monitor your response to treatment. What kind of sample is taken? A blood sample is required for this test. It is usually collected by inserting a needle into a vein. How do I prepare for this test? There is no preparation required for this test. What are the reference ranges? Reference ranges are considered healthy ranges established after testing a large group of healthy people. Reference ranges may vary among different people, labs, and hospitals. It is your responsibility to obtain your test results. Ask the lab or department performing the test when and how you will get your results. The reference range for this test is 0-35 units/mL or less than 35 kunits/L (SI units). What do the results mean? Increased levels of CA-125 may indicate:  Certain types of cancer, including:  Ovarian cancer.  Pancreatic cancer.  Colon cancer.  Lung cancer.  Breast cancer.  Lymphoma.  Noncancerous (benign) disorders, including:  Cirrhosis.  Pregnancy.  Endometriosis.  Pancreatitis.  Pelvic inflammatory disease (PID). Talk with your health care provider to discuss your results, treatment options, and if necessary, the need for more tests. Talk with your health care provider if you have any questions about your results. Talk with your health care provider to discuss your results, treatment options,  and if necessary, the need for more tests. Talk with your health care provider if you have any questions about your results. This information is not intended to replace advice given to you by your health care provider. Make sure you discuss any questions you have with your health care provider. Document Released: 12/27/2004 Document Revised: 08/09/2016 Document Reviewed: 04/24/2014 Elsevier Interactive Patient Education  2017 [place]. Ovarian Cyst  An ovarian cyst is a fluid-filled sac that forms on an ovary. The ovaries are small organs that produce eggs in women. Various types of cysts can form on the ovaries. Some may cause symptoms and require treatment. Most ovarian cysts go away on their own, are not cancerous (are benign), and do not cause problems. Common types of ovarian cysts include:  Functional (follicle) cysts.  Occur during the menstrual cycle, and usually go away with the next menstrual cycle if you do not get pregnant.  Usually cause no symptoms.  Endometriomas.  Are cysts that form from the tissue that lines the uterus (endometrium).  Are sometimes called "chocolate cysts" because they become filled with blood that turns brown.  Can cause pain in the lower abdomen during intercourse and during your period.  Cystadenoma cysts.  Develop from cells on the outside surface of the ovary.  Can get very large and cause lower abdomen pain and pain with intercourse.  Can cause severe pain if they twist or break open (rupture).  Dermoid cysts.  Are sometimes found in both ovaries.  May contain different kinds of body tissue, such as skin, teeth, hair, or cartilage.  Usually do not  cause symptoms unless they get very big.  Theca lutein cysts.  Occur when too much of a certain hormone (human chorionic gonadotropin) is produced and overstimulates the ovaries to produce an egg.  Are most common after having procedures used to assist with the conception of a baby (in  vitro fertilization). What are the causes? Ovarian cysts may be caused by:  Ovarian hyperstimulation syndrome. This is a condition that can develop from taking fertility medicines. It causes multiple large ovarian cysts to form.  Polycystic ovarian syndrome (PCOS). This is a common hormonal disorder that can cause ovarian cysts, as well as problems with your period or fertility. What increases the risk? The following factors may make you more likely to develop ovarian cysts:  Being overweight or obese.  Taking fertility medicines.  Taking certain forms of hormonal birth control.  Smoking. What are the signs or symptoms? Many ovarian cysts do not cause symptoms. If symptoms are present, they may include:  Pelvic pain or pressure.  Pain in the lower abdomen.  Pain during sex.  Abdominal swelling.  Abnormal menstrual periods.  Increasing pain with menstrual periods. How is this diagnosed? These cysts are commonly found during a routine pelvic exam. You may have tests to find out more about the cyst, such as:  Ultrasound.  X-ray of the pelvis.  CT scan.  MRI.  Blood tests. How is this treated? Many ovarian cysts go away on their own without treatment. Your health care provider may want to check your cyst regularly for 2-3 months to see if it changes. If you are in menopause, it is especially important to have your cyst monitored closely because menopausal women have a higher rate of ovarian cancer. When treatment is needed, it may include:  Medicines to help relieve pain.  A procedure to drain the cyst (aspiration).  Surgery to remove the whole cyst.  Hormone treatment or birth control pills. These methods are sometimes used to help dissolve a cyst. Follow these instructions at home:  Take over-the-counter and prescription medicines only as told by your health care provider.  Do not drive or use heavy machinery while taking prescription pain medicine.  Get  regular pelvic exams and Pap tests as often as told by your health care provider.  Return to your normal activities as told by your health care provider. Ask your health care provider what activities are safe for you.  Do not use any products that contain nicotine or tobacco, such as cigarettes and e-cigarettes. If you need help quitting, ask your health care provider.  Keep all follow-up visits as told by your health care provider. This is important. Contact a health care provider if:  Your periods are late, irregular, or painful, or they stop.  You have pelvic pain that does not go away.  You have pressure on your bladder or trouble emptying your bladder completely.  You have pain during sex.  You have any of the following in your abdomen:  A feeling of fullness.  Pressure.  Discomfort.  Pain that does not go away.  Swelling.  You feel generally ill.  You become constipated.  You lose your appetite.  You develop severe acne.  You start to have more body hair and facial hair.  You are gaining weight or losing weight without changing your exercise and eating habits.  You think you may be pregnant. Get help right away if:  You have abdominal pain that is severe or gets worse.  You cannot eat   or drink without vomiting.  You suddenly develop a fever.  Your menstrual period is much heavier than usual. This information is not intended to replace advice given to you by your health care provider. Make sure you discuss any questions you have with your health care provider. Document Released: 12/05/2005 Document Revised: 06/24/2016 Document Reviewed: 05/08/2016 Elsevier Interactive Patient Education  2017 [employer] Uterine Fibroids Uterine fibroids are tissue masses (tumors) that can develop in the womb (uterus). They are also called leiomyomas. This type of tumor is not cancerous (benign) and does not spread to other parts of the body outside of the pelvic area,  which is between the hip bones. Occasionally, fibroids may develop in the fallopian tubes, in the cervix, or on the support structures (ligaments) that surround the uterus. You can have one or many fibroids. Fibroids can vary in size, weight, and where they grow in the uterus. Some can become quite large. Most fibroids do not require medical treatment. What are the causes? A fibroid can develop when a single uterine cell keeps growing (replicating). Most cells in the human body have a control mechanism that keeps them from replicating without control. What are the signs or symptoms? Symptoms may include:  Heavy bleeding during your period.  Bleeding or spotting between periods.  Pelvic pain and pressure.  Bladder problems, such as needing to urinate more often (urinary frequency) or urgently.  Inability to reproduce offspring (infertility).  Miscarriages. How is this diagnosed? Uterine fibroids are diagnosed through a physical exam. Your health care provider may feel the lumpy tumors during a pelvic exam. Ultrasonography and an MRI may be done to determine the size, location, and number of fibroids. How is this treated? Treatment may include:  Watchful waiting. This involves getting the fibroid checked by your health care provider to see if it grows or shrinks. Follow your health care provider's recommendations for how often to have this checked.  Hormone medicines. These can be taken by mouth or given through an intrauterine device (IUD).  Surgery.  Removing the fibroids (myomectomy) or the uterus (hysterectomy).  Removing blood supply to the fibroids (uterine artery embolization). If fibroids interfere with your fertility and you want to become pregnant, your health care provider may recommend having the fibroids removed. Follow these instructions at home:  Keep all follow-up visits as directed by your health care provider. This is important.  Take over-the-counter and  prescription medicines only as told by your health care provider.  If you were prescribed a hormone treatment, take the hormone medicines exactly as directed.  Ask your health care provider about taking iron pills and increasing the amount of dark green, leafy vegetables in your diet. These actions can help to boost your blood iron levels, which may be affected by heavy menstrual bleeding.  Pay close attention to your period and tell your health care provider about any changes, such as:  Increased blood flow that requires you to use more pads or tampons than usual per month.  A change in the number of days that your period lasts per month.  A change in symptoms that are associated with your period, such as abdominal cramping or back pain. Contact a health care provider if:  You have pelvic pain, back pain, or abdominal cramps that cannot be controlled with medicines.  You have an increase in bleeding between and during periods.  You soak tampons or pads in a half hour or less.  You feel lightheaded, extra tired, or weak.  Get help right away if:  You faint.  You have a sudden increase in pelvic pain. This information is not intended to replace advice given to you by your health care provider. Make sure you discuss any questions you have with your health care provider. Document Released: 12/02/2000 Document Revised: 08/04/2016 Document Reviewed: 06/03/2014 Elsevier Interactive Patient Education  2017 [place] is the time when your body begins to move into the menopause (no menstrual period for 12 straight months). It is a natural process. Perimenopause can begin 2-8 years before the menopause and usually lasts for 1 year after the menopause. During this time, your ovaries may or may not produce an egg. The ovaries vary in their production of estrogen and progesterone hormones each month. This can cause irregular menstrual periods, difficulty getting  pregnant, vaginal bleeding between periods, and uncomfortable symptoms. CAUSES  Irregular production of the ovarian hormones, estrogen and progesterone, and not ovulating every month.  Other causes include:  Tumor of the pituitary gland in the brain.  Medical disease that affects the ovaries.  Radiation treatment.  Chemotherapy.  Unknown causes.  Heavy smoking and excessive alcohol intake can bring on perimenopause sooner. SIGNS AND SYMPTOMS   Hot flashes.  Night sweats.  Irregular menstrual periods.  Decreased sex drive.  Vaginal dryness.  Headaches.  Mood swings.  Depression.  Memory problems.  Irritability.  Tiredness.  Weight gain.  Trouble getting pregnant.  The beginning of losing bone cells (osteoporosis).  The beginning of hardening of the arteries (atherosclerosis). DIAGNOSIS  Your health care provider will make a diagnosis by analyzing your age, menstrual history, and symptoms. He or she will do a physical exam and note any changes in your body, especially your female organs. Female hormone tests may or may not be helpful depending on the amount of female hormones you produce and when you produce them. However, other hormone tests may be helpful to rule out other problems. TREATMENT  In some cases, no treatment is needed. The decision on whether treatment is necessary during the perimenopause should be made by you and your health care provider based on how the symptoms are affecting you and your lifestyle. Various treatments are available, such as:  Treating individual symptoms with a specific medicine for that symptom.  Herbal medicines that can help specific symptoms.  Counseling.  Group therapy. HOME CARE INSTRUCTIONS   Keep track of your menstrual periods (when they occur, how heavy they are, how long between periods, and how long they last) as well as your symptoms and when they started.  Only take over-the-counter or prescription  medicines as directed by your health care provider.  Sleep and rest.  Exercise.  Eat a diet that contains calcium (good for your bones) and soy (acts like the estrogen hormone).  Do not smoke.  Avoid alcoholic beverages.  Take vitamin supplements as recommended by your health care provider. Taking vitamin E may help in certain cases.  Take calcium and vitamin D supplements to help prevent bone loss.  Group therapy is sometimes helpful.  Acupuncture may help in some cases. SEEK MEDICAL CARE IF:   You have questions about any symptoms you are having.  You need a referral to a specialist (gynecologist, psychiatrist, or psychologist). SEEK IMMEDIATE MEDICAL CARE IF:   You have vaginal bleeding.  Your period lasts longer than 8 days.  Your periods are recurring sooner than 21 days.  You have bleeding after intercourse.  You have severe depression.  You  have pain when you urinate.  You have severe headaches.  You have vision problems. This information is not intended to replace advice given to you by your health care provider. Make sure you discuss any questions you have with your health care provider. Document Released: 01/12/2005 Document Revised: 12/26/2014 Document Reviewed: 07/04/2013 Elsevier Interactive Patient Education  2017 [employer].

## 2017-02-07 ENCOUNTER — Encounter (HOSPITAL_BASED_OUTPATIENT_CLINIC_OR_DEPARTMENT_OTHER): Payer: Self-pay | Admitting: Gynecology

## 2017-02-09 ENCOUNTER — Encounter (HOSPITAL_BASED_OUTPATIENT_CLINIC_OR_DEPARTMENT_OTHER): Payer: Self-pay | Admitting: *Deleted

## 2017-02-10 ENCOUNTER — Encounter (HOSPITAL_BASED_OUTPATIENT_CLINIC_OR_DEPARTMENT_OTHER): Payer: Self-pay | Admitting: *Deleted

## 2017-02-10 NOTE — Progress Notes (Signed)
NPO AFTER MN W/ EXCEPTION CLEAR LIQUIDS UNTIL 0700 (NO CREAM/ MILK PRODUCTS).  ARRIVE AT 1130.  NEEDS URINE PREG.  PT HAS PRE-OP VISIT ON Tuesday, 02-14-2017 AND WANTS TO GET CBC DONE THERE.  GAVE PT KATHY'S PHONE NUMBER TO CALL AND  ASK HER IF OK TO DO LAB THERE.

## 2017-02-13 NOTE — Progress Notes (Signed)
Received call from [place] , [place] scheduler for Dr. Toney Rakes. Pt needs clarification for  Location to have  preop labs drawn.  Juliann Pulse informed that according to last progress note.  Pt requested to have labs drawn @ preop visit 2/27 at their office.  Juliann Pulse will call pt regarding this matter.

## 2017-02-14 ENCOUNTER — Ambulatory Visit (INDEPENDENT_AMBULATORY_CARE_PROVIDER_SITE_OTHER): Payer: BC Managed Care – PPO | Admitting: Gynecology

## 2017-02-14 ENCOUNTER — Other Ambulatory Visit: Payer: Self-pay | Admitting: Gynecology

## 2017-02-14 ENCOUNTER — Encounter: Payer: Self-pay | Admitting: Gynecology

## 2017-02-14 VITALS — BP 124/80 | Ht 63.0 in | Wt 135.0 lb

## 2017-02-14 DIAGNOSIS — Z01812 Encounter for preprocedural laboratory examination: Secondary | ICD-10-CM

## 2017-02-14 DIAGNOSIS — Z01818 Encounter for other preprocedural examination: Secondary | ICD-10-CM | POA: Diagnosis not present

## 2017-02-14 DIAGNOSIS — N938 Other specified abnormal uterine and vaginal bleeding: Secondary | ICD-10-CM | POA: Diagnosis not present

## 2017-02-14 DIAGNOSIS — N840 Polyp of corpus uteri: Secondary | ICD-10-CM | POA: Diagnosis not present

## 2017-02-14 DIAGNOSIS — D500 Iron deficiency anemia secondary to blood loss (chronic): Secondary | ICD-10-CM

## 2017-02-14 DIAGNOSIS — D251 Intramural leiomyoma of uterus: Secondary | ICD-10-CM | POA: Diagnosis not present

## 2017-02-14 LAB — CBC WITH DIFFERENTIAL/PLATELET
BASOS PCT: 1 %
Basophils Absolute: 79 cells/uL (ref 0–200)
EOS PCT: 3 %
Eosinophils Absolute: 237 cells/uL (ref 15–500)
HEMATOCRIT: 35.1 % (ref 35.0–45.0)
Hemoglobin: 10.7 g/dL — ABNORMAL LOW (ref 11.7–15.5)
LYMPHS PCT: 25 %
Lymphs Abs: 1975 cells/uL (ref 850–3900)
MCH: 22.6 pg — ABNORMAL LOW (ref 27.0–33.0)
MCHC: 30.5 g/dL — AB (ref 32.0–36.0)
MCV: 74.2 fL — ABNORMAL LOW (ref 80.0–100.0)
MONO ABS: 395 {cells}/uL (ref 200–950)
Monocytes Relative: 5 %
NEUTROS PCT: 66 %
Neutro Abs: 5214 cells/uL (ref 1500–7800)
PLATELETS: 372 10*3/uL (ref 140–400)
RBC: 4.73 MIL/uL (ref 3.80–5.10)
RDW: 28.6 % — AB (ref 11.0–15.0)
WBC: 7.9 10*3/uL (ref 3.8–10.8)

## 2017-02-14 LAB — PREGNANCY, URINE: PREG TEST UR: NEGATIVE

## 2017-02-14 NOTE — Progress Notes (Addendum)
Sandy White is an 56 y.o. female for preop exam. Patient scheduled for resectoscopic polypectomy this Thursday. Her history is as follows:  Patient is a 56 year old gravida 4 para 4 (first 3 pregnancies were vaginal delivery the fourth one was a cesarean section along with tubal ligation). who has not been seen the office since 2016. Patient is here for evaluation with sonohysterogram and endometrial biopsy as a result of her irregular vaginal bleeding. Patient with past history of iron deficiency anemia her PCP sent her to the hospital last week she was transfused 2 units of packed red blood cells due to the fact her hemoglobin was 6.8 upon completion of transfusion her hemoglobin was 9.0 she's currently on iron sulfate one tablet twice a day. She called the office side called in a prescription for Megace 40 mg twice a day to help stop her bleeding to the evaluation today.  Patient had an ultrasound 2016 which demonstrated the following: Ultrasound reviewed from 10/12/2011 had demonstrated she had a uterus that measured 10.6 x 7.8 x 6.1 cm with endometrial stripe 11.3 mm and she had several intramural myomas the largest one measuring 21 x 15 mm and was calcified. They described prominent endometrial cavity with 2 echogenic foci 18 x 9 mm, and 7 x 11 mm with color flow to the area. She had a right corpus luteum cyst left ovary was normal.   Patient also had an endometrial biopsy which demonstrated the following: Diagnosis Endometrium, biopsy, uterus - BENIGN ENDOMETRIAL POLYP IN A SETTING OF PROLIFERATIVE ENDOMETRIUM. - NO ENDOMETRIAL HYPERPLASIA, ATYPIA OR MALIGNANCY IDENTIFIED.  That same office visit in 2016 she had a sonohysterogram and no intracavitary defects she was once again noted to have several fibroids the largest one measuring 19 x 19 x 25 mm the ovaries were normal. Endometrial stripe and was 7.5 mm.   Ultrasound/sono hysterogram 02/06/2017: Uterus measured 11.1 x 7.8 x  6.7 cm patient with 5 fibroids the largest one measuring 20 x 28 mm. Right ovary paraovarian cyst measuring 14 x 16 mm. Left ovary a thin-walled echo-free cyst measuring 36 x 21 x 31 mm. Thickened endometrium with an echogenic focus measuring 23 x 14 mm was noted negative color flow. Questionable arcuate uterus although by CT scan a few years ago there was no report of this. The cervix is then cleansed with Betadine solution a sterile catheter was introduced into the uterine cavity to endometrial polyps were noted one measuring 23 x 29 x 12 mm, the second 1 19 x 15 mm was noted.    Pertinent Gynecological History: Menses: DUB Bleeding: dysfunctional uterine bleeding Contraception: tubal ligation DES exposure: unknown Blood transfusions: none Sexually transmitted diseases: no past history Previous GYN Procedures: 3 NSVD, 1 csect  Last mammogram: normal Date: 2017 Last pap: abnormal: ASCUS neg HPV Date: 2018  OB History: G4, P4   Menstrual History: Menarche age: 84 Patient's last menstrual period was 12/15/2016 (approximate).    Past Medical History:  Diagnosis Date  . Anemia due to chronic blood loss    heavy uterine bleeding ---  02-03-2017  transfused 2 units RBS's for HG 6.8  . Constipation    due to iron supplement  . DUB (dysfunctional uterine bleeding)   . Endometrial polyp   . GERD (gastroesophageal reflux disease)   . History of basal cell carcinoma excision   . 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 Hypothyroidism   . Migraine   . Thyroid nodule    per pt benign biospy  . Uterine fibroid   . Wears glasses     Past Surgical History:  Procedure Laterality Date  . APPENDECTOMY  age 89  . CESAREAN SECTION W/BTL  10/27/2005  . EXCISIONAL BIOPSY RIGHT BREAST MASS  06/2013   atypical ductal hyperplasia  . TONSILLECTOMY  age 66    . WIDE LOCAL EXCISION MELANOMA RIGHT UPPER SHOULDER REGION/  LYMPH NODE DISSECTION'S/  SKIN GRAFT  09/ 2013   [facility]  . WISDOM TOOTH EXTRACTION  1990's    Family History  Problem Relation Age of Onset  . Breast cancer Mother   . Heart disease Father   . Breast cancer Sister     had doublem mastectomy  . Breast cancer Maternal Grandmother   . Breast cancer Sister     Social History:  reports that she has never smoked. She has never used smokeless tobacco. She reports that she does not drink alcohol or use drugs.  Allergies:  Allergies  Allergen Reactions  . Codeine Nausea And Vomiting    Severe n/v and severe headache     (Not in a hospital admission)  REVIEW OF SYSTEMS: A ROS was performed and pertinent positives and negatives are included in the history.  GENERAL: No fevers or chills. HEENT: No change in vision, no earache, sore throat or sinus congestion. NECK: No pain or stiffness. CARDIOVASCULAR: No chest pain or pressure. No palpitations. PULMONARY: No shortness of breath, cough or wheeze. GASTROINTESTINAL: No abdominal pain, nausea, vomiting or diarrhea, melena or bright red blood per rectum. GENITOURINARY: No urinary frequency, urgency, hesitancy or dysuria. MUSCULOSKELETAL: No joint or muscle pain, no back pain, no recent trauma. DERMATOLOGIC: No rash, no itching, no lesions. ENDOCRINE: No polyuria, polydipsia, no heat or cold intolerance. No recent change in weight. HEMATOLOGICAL: No anemia or easy bruising or bleeding. NEUROLOGIC: No headache, seizures, numbness, tingling or weakness. PSYCHIATRIC: No depression, no loss of interest in normal activity or change in sleep pattern.     Blood pressure 124/80, height 5\' 3"  (1.6 m), weight 135 lb (61.2 kg), last menstrual period 12/15/2016.  Physical Exam:  HEENT:unremarkable Neck:Supple, midline, no thyroid megaly, no carotid bruits Lungs:  Clear to auscultation no rhonchi's or wheezes Heart:Regular rate and rhythm, no  murmurs or gallops Breast Exam: Symmetrical and appears no palpable mass or tenderness no supra clavicular axillary lymphadenopathy Abdomen: Soft nontender no rebound or guarding Pelvic:BUS within normal limits Vagina: No lesions or discharge Cervix: No lesions or discharge Uterus: Anteverted mobile nontender Adnexa: No palpable masses or tenderness Extremities: No cords, no edema Rectal: Not examined   Assessment/Plan:  Patient with fibroid uterus dysfunctional bleeding and endometrial polyp. She is scheduled to undergo resectoscopic polypectomy this Thursday. Originally she was to have removed  the polyps as well as  placement a Mirena IUD. Patient decided against the Mirena IUD and wait to see if she continues to bleed after the polyps are removed. If she begins to bleed afterwards she states that at that time she may decide to put the Mirena IUD and if that fails she would then proceed  with hysterectomy. Meanwhile she'll continue her iron tablet twice a day and I have asked to continue her Megace 40 mg twice a day until 1 week postop. The risks and benefits from surgery as discussed below:  Patient was counseled as to the risk of surgery to include the following:  1. Infection (prohylactic antibiotics will be administered)  2. DVT/Pulmonary Embolism (prophylactic pneumo compression stockings will be used)  3.Trauma to internal organs requiring additional surgical procedure to repair any injury to     Internal organs requiring perhaps additional hospitalization days.  4.Hemmorhage requiring transfusion and blood products which carry risks such as  anaphylactic reaction, hepatitis and AIDS  Patient had received literature information on the procedure scheduled and all her questions were answered and fully accepts all risk.   [facility] HMD2:43 PMTD     Terrance Mass 02/14/2017, 2:14 PM

## 2017-02-16 ENCOUNTER — Encounter (HOSPITAL_BASED_OUTPATIENT_CLINIC_OR_DEPARTMENT_OTHER)
Admission: RE | Disposition: A | Discharge status: Home / Self Care | Payer: Self-pay | Source: Ambulatory Visit | Attending: Gynecology

## 2017-02-16 ENCOUNTER — Encounter (HOSPITAL_BASED_OUTPATIENT_CLINIC_OR_DEPARTMENT_OTHER): Payer: Self-pay | Admitting: Anesthesiology

## 2017-02-16 ENCOUNTER — Ambulatory Visit (HOSPITAL_BASED_OUTPATIENT_CLINIC_OR_DEPARTMENT_OTHER)
Admission: RE | Admit: 2017-02-16 | Discharge: 2017-02-16 | Disposition: A | Discharge status: Home / Self Care | Payer: BC Managed Care – PPO | Source: Ambulatory Visit | Attending: Gynecology | Admitting: Gynecology

## 2017-02-16 ENCOUNTER — Ambulatory Visit (HOSPITAL_BASED_OUTPATIENT_CLINIC_OR_DEPARTMENT_OTHER): Payer: BC Managed Care – PPO | Admitting: Anesthesiology

## 2017-02-16 DIAGNOSIS — Z9049 Acquired absence of other specified parts of digestive tract: Secondary | ICD-10-CM | POA: Diagnosis not present

## 2017-02-16 DIAGNOSIS — N938 Other specified abnormal uterine and vaginal bleeding: Secondary | ICD-10-CM | POA: Insufficient documentation

## 2017-02-16 DIAGNOSIS — Z885 Allergy status to narcotic agent status: Secondary | ICD-10-CM | POA: Diagnosis not present

## 2017-02-16 DIAGNOSIS — Z85820 Personal history of malignant melanoma of skin: Secondary | ICD-10-CM | POA: Diagnosis not present

## 2017-02-16 DIAGNOSIS — K219 Gastro-esophageal reflux disease without esophagitis: Secondary | ICD-10-CM | POA: Diagnosis not present

## 2017-02-16 DIAGNOSIS — D500 Iron deficiency anemia secondary to blood loss (chronic): Secondary | ICD-10-CM | POA: Diagnosis not present

## 2017-02-16 DIAGNOSIS — E039 Hypothyroidism, unspecified: Secondary | ICD-10-CM | POA: Diagnosis not present

## 2017-02-16 DIAGNOSIS — D259 Leiomyoma of uterus, unspecified: Secondary | ICD-10-CM | POA: Diagnosis not present

## 2017-02-16 DIAGNOSIS — Z85828 Personal history of other malignant neoplasm of skin: Secondary | ICD-10-CM | POA: Diagnosis not present

## 2017-02-16 DIAGNOSIS — N840 Polyp of corpus uteri: Secondary | ICD-10-CM | POA: Insufficient documentation

## 2017-02-16 DIAGNOSIS — L989 Disorder of the skin and subcutaneous tissue, unspecified: Secondary | ICD-10-CM | POA: Diagnosis not present

## 2017-02-16 DIAGNOSIS — D509 Iron deficiency anemia, unspecified: Secondary | ICD-10-CM | POA: Insufficient documentation

## 2017-02-16 DIAGNOSIS — Z9889 Other specified postprocedural states: Secondary | ICD-10-CM | POA: Diagnosis not present

## 2017-02-16 DIAGNOSIS — Z803 Family history of malignant neoplasm of breast: Secondary | ICD-10-CM | POA: Insufficient documentation

## 2017-02-16 DIAGNOSIS — Z8249 Family history of ischemic heart disease and other diseases of the circulatory system: Secondary | ICD-10-CM | POA: Diagnosis not present

## 2017-02-16 DIAGNOSIS — I781 Nevus, non-neoplastic: Secondary | ICD-10-CM | POA: Diagnosis not present

## 2017-02-16 DIAGNOSIS — Z79899 Other long term (current) drug therapy: Secondary | ICD-10-CM | POA: Diagnosis not present

## 2017-02-16 HISTORY — PX: EXCISION OF SKIN TAG: SHX6270

## 2017-02-16 HISTORY — DX: Other specified abnormal uterine and vaginal bleeding: N93.8

## 2017-02-16 HISTORY — DX: Hypothyroidism, unspecified: E03.9

## 2017-02-16 HISTORY — DX: Iron deficiency anemia secondary to blood loss (chronic): D50.0

## 2017-02-16 HISTORY — DX: Leiomyoma of uterus, unspecified: D25.9

## 2017-02-16 HISTORY — PX: DILATATION & CURETTAGE/HYSTEROSCOPY WITH MYOSURE: SHX6511

## 2017-02-16 HISTORY — DX: Polyp of corpus uteri: N84.0

## 2017-02-16 HISTORY — DX: Personal history of other malignant neoplasm of skin: Z85.828

## 2017-02-16 HISTORY — DX: Nontoxic single thyroid nodule: E04.1

## 2017-02-16 HISTORY — DX: Personal history of other malignant neoplasm of skin: Z98.890

## 2017-02-16 HISTORY — DX: Gastro-esophageal reflux disease without esophagitis: K21.9

## 2017-02-16 HISTORY — DX: Presence of spectacles and contact lenses: Z97.3

## 2017-02-16 HISTORY — DX: Personal history of malignant melanoma of skin: Z85.820

## 2017-02-16 HISTORY — DX: Constipation, unspecified: K59.00

## 2017-02-16 HISTORY — DX: Other specified postprocedural states: Z85.828

## 2017-02-16 SURGERY — DILATATION & CURETTAGE/HYSTEROSCOPY WITH MYOSURE
Anesthesia: General | Site: Uterus

## 2017-02-16 MED ORDER — BACITRACIN-NEOMYCIN-POLYMYXIN OINTMENT TUBE
TOPICAL_OINTMENT | CUTANEOUS | Status: DC | PRN
  Administered 2017-02-16: 1 via TOPICAL

## 2017-02-16 MED ORDER — FENTANYL CITRATE (PF) 100 MCG/2ML IJ SOLN
INTRAMUSCULAR | Status: DC | PRN
  Administered 2017-02-16 (×2): 50 ug via INTRAVENOUS

## 2017-02-16 MED ORDER — PROPOFOL 10 MG/ML IV BOLUS
INTRAVENOUS | Status: AC
  Filled 2017-02-16: qty 20

## 2017-02-16 MED ORDER — SCOPOLAMINE 1 MG/3DAYS TD PT72
MEDICATED_PATCH | TRANSDERMAL | Status: AC
  Filled 2017-02-16: qty 1

## 2017-02-16 MED ORDER — HYDROMORPHONE HCL 1 MG/ML IJ SOLN: 0.2500 mg | INTRAMUSCULAR | 0 refills | Status: DC | PRN

## 2017-02-16 MED ORDER — LIDOCAINE 2% (20 MG/ML) 5 ML SYRINGE
INTRAMUSCULAR | Status: DC | PRN
  Administered 2017-02-16: 100 mg via INTRAVENOUS

## 2017-02-16 MED ORDER — LIDOCAINE 2% (20 MG/ML) 5 ML SYRINGE
INTRAMUSCULAR | Status: AC
  Filled 2017-02-16: qty 5

## 2017-02-16 MED ORDER — MIDAZOLAM HCL 5 MG/5ML IJ SOLN
INTRAMUSCULAR | Status: DC | PRN
  Administered 2017-02-16: 2 mg via INTRAVENOUS

## 2017-02-16 MED ORDER — ONDANSETRON HCL 4 MG/2ML IJ SOLN
INTRAMUSCULAR | Status: AC
  Filled 2017-02-16: qty 2

## 2017-02-16 MED ORDER — KETOROLAC TROMETHAMINE 30 MG/ML IJ SOLN
INTRAMUSCULAR | Status: DC | PRN
  Administered 2017-02-16: 30 mg via INTRAVENOUS

## 2017-02-16 MED ORDER — DEXAMETHASONE SODIUM PHOSPHATE 4 MG/ML IJ SOLN
INTRAMUSCULAR | Status: DC | PRN
  Administered 2017-02-16: 10 mg via INTRAVENOUS

## 2017-02-16 MED ORDER — LACTATED RINGERS IV SOLN
INTRAVENOUS | Status: DC
  Administered 2017-02-16: 12:00:00 via INTRAVENOUS
  Filled 2017-02-16: qty 1000

## 2017-02-16 MED ORDER — SILVER NITRATE-POT NITRATE 75-25 % EX MISC
CUTANEOUS | Status: DC | PRN
  Administered 2017-02-16: 1

## 2017-02-16 MED ORDER — PROPOFOL 10 MG/ML IV BOLUS
INTRAVENOUS | Status: DC | PRN
  Administered 2017-02-16: 30 mg via INTRAVENOUS
  Administered 2017-02-16: 170 mg via INTRAVENOUS

## 2017-02-16 MED ORDER — DEXAMETHASONE SODIUM PHOSPHATE 10 MG/ML IJ SOLN
INTRAMUSCULAR | Status: AC
  Filled 2017-02-16: qty 1

## 2017-02-16 MED ORDER — KETOROLAC TROMETHAMINE 30 MG/ML IJ SOLN
INTRAMUSCULAR | Status: AC
Start: 2017-02-16 — End: 2017-02-16
  Filled 2017-02-16: qty 1

## 2017-02-16 MED ORDER — CEFOTETAN DISODIUM-DEXTROSE 2-2.08 GM-% IV SOLR
INTRAVENOUS | Status: AC
  Filled 2017-02-16: qty 50

## 2017-02-16 MED ORDER — PROMETHAZINE HCL 25 MG/ML IJ SOLN
6.2500 mg | INTRAMUSCULAR | Status: DC | PRN
  Filled 2017-02-16: qty 1

## 2017-02-16 MED ORDER — HYDROMORPHONE HCL 1 MG/ML IJ SOLN
0.2500 mg | INTRAMUSCULAR | Status: DC | PRN
  Filled 2017-02-16: qty 0.5

## 2017-02-16 MED ORDER — MIDAZOLAM HCL 2 MG/2ML IJ SOLN
INTRAMUSCULAR | Status: AC
  Filled 2017-02-16: qty 2

## 2017-02-16 MED ORDER — DEXTROSE 5 % IV SOLN
2.0000 g | INTRAVENOUS | Status: AC
  Administered 2017-02-16: 2 g via INTRAVENOUS
  Filled 2017-02-16: qty 2

## 2017-02-16 MED ORDER — LIDOCAINE-EPINEPHRINE 1 %-1:100000 IJ SOLN
INTRAMUSCULAR | Status: DC | PRN
Start: 2017-02-16 — End: 2017-02-16
  Administered 2017-02-16: 10 mL
  Administered 2017-02-16: 4 mL

## 2017-02-16 MED ORDER — FENTANYL CITRATE (PF) 100 MCG/2ML IJ SOLN
INTRAMUSCULAR | Status: AC
Start: 2017-02-16 — End: 2017-02-16
  Filled 2017-02-16: qty 2

## 2017-02-16 MED ORDER — ONDANSETRON HCL 4 MG/2ML IJ SOLN
INTRAMUSCULAR | Status: DC | PRN
  Administered 2017-02-16: 4 mg via INTRAVENOUS

## 2017-02-16 MED ORDER — SODIUM CHLORIDE 0.9 % IR SOLN
Status: DC | PRN
  Administered 2017-02-16: 900 mL

## 2017-02-16 SURGICAL SUPPLY — 42 items
BLADE SURG 15 STRL LF DISP TIS (BLADE) ×3 IMPLANT
BLADE SURG 15 STRL SS (BLADE) ×1
CANISTER SUCT 3000ML PPV (MISCELLANEOUS) ×4 IMPLANT
CATH FOLEY 2WAY SLVR 30CC 16FR (CATHETERS) IMPLANT
CATH ROBINSON RED A/P 16FR (CATHETERS) ×4 IMPLANT
CONTAINER PREFILL 10% NBF 60ML (FORM) IMPLANT
COVER BACK TABLE 60X90IN (DRAPES) ×4 IMPLANT
DEVICE MYOSURE LITE (MISCELLANEOUS) ×4 IMPLANT
DEVICE MYOSURE REACH (MISCELLANEOUS) IMPLANT
DRAPE HYSTEROSCOPY (DRAPE) ×4 IMPLANT
DRAPE LG THREE QUARTER DISP (DRAPES) ×4 IMPLANT
DRSG COVADERM PLUS 2X2 (GAUZE/BANDAGES/DRESSINGS) ×4 IMPLANT
DRSG TELFA 3X8 NADH (GAUZE/BANDAGES/DRESSINGS) ×4 IMPLANT
ELECT NEEDLE TIP 2.8 STRL (NEEDLE) ×4 IMPLANT
ELECT REM PT RETURN 9FT ADLT (ELECTROSURGICAL) ×4
ELECTRODE REM PT RTRN 9FT ADLT (ELECTROSURGICAL) ×3 IMPLANT
FILTER ARTHROSCOPY CONVERTOR (FILTER) ×4 IMPLANT
GLOVE BIOGEL PI IND STRL 8 (GLOVE) ×3 IMPLANT
GLOVE BIOGEL PI INDICATOR 8 (GLOVE) ×1
GLOVE ECLIPSE 7.5 STRL STRAW (GLOVE) ×8 IMPLANT
GOWN STRL REUS W/TWL LRG LVL3 (GOWN DISPOSABLE) ×8 IMPLANT
LEGGING LITHOTOMY PAIR STRL (DRAPES) ×4 IMPLANT
MYOSURE XL FIBROID REM (MISCELLANEOUS)
NEEDLE HYPO 25X1 1.5 SAFETY (NEEDLE) ×4 IMPLANT
PACK BASIN DAY SURGERY FS (CUSTOM PROCEDURE TRAY) ×4 IMPLANT
PAD OB MATERNITY 4.3X12.25 (PERSONAL CARE ITEMS) ×4 IMPLANT
PAD PREP 24X48 CUFFED NSTRL (MISCELLANEOUS) ×4 IMPLANT
PENCIL BUTTON HOLSTER BLD 10FT (ELECTRODE) ×4 IMPLANT
PLUG CATH AND CAP STER (CATHETERS) IMPLANT
SEAL ROD LENS SCOPE MYOSURE (ABLATOR) ×4 IMPLANT
SOL PREP PROV IODINE SCRUB 4OZ (MISCELLANEOUS) ×4 IMPLANT
SUT VIC AB 1-0 CT2 27 (SUTURE) ×4 IMPLANT
SUT VIC AB 2-0 SH 27 (SUTURE) ×1
SUT VIC AB 2-0 SH 27XBRD (SUTURE) ×3 IMPLANT
SYR 30ML LL (SYRINGE) IMPLANT
SYR CONTROL 10ML LL (SYRINGE) ×8 IMPLANT
SYSTEM TISS REMOVAL MYSR XL RM (MISCELLANEOUS) IMPLANT
TOWEL OR 17X24 6PK STRL BLUE (TOWEL DISPOSABLE) ×8 IMPLANT
TRAY DSU PREP LF (CUSTOM PROCEDURE TRAY) ×4 IMPLANT
TUBING AQUILEX INFLOW (TUBING) ×4 IMPLANT
TUBING AQUILEX OUTFLOW (TUBING) ×4 IMPLANT
WATER STERILE IRR 500ML POUR (IV SOLUTION) IMPLANT

## 2017-02-16 NOTE — Discharge Instructions (Addendum)
May use tylenol,motrin as need for pain Triple antibiotic ointment daily to skin tag sites- cover as need for comfort.  Home care Instructions:   Personal hygiene:  Used sanitary napkins for vaginal drainage not tampons. Shower or tub bathe the day after your procedure. No douching until bleeding stops. Always wipe from front to back after  Elimination.  Activity: Do not drive or operate any equipment today. The effects of the anesthesia are still present and drowsiness may result. Rest today, not necessarily flat bed rest, just take it easy. You may resume your normal activity in one to 2 days.  Sexual activity: No intercourse for one week or as indicated by your physician  Diet: Eat a light diet as desired this evening. You may resume a regular diet tomorrow.  Return to work: One to 2 days.  General Expectations of your surgery: Vaginal bleeding should be no heavier than a normal period. Spotting may continue up to 10 days. Mild cramps may continue for a couple of days. You may have a regular period in 2-6 weeks.  Unexpected observations call your doctor if these occur: persistent or heavy bleeding. Severe abdominal cramping or pain. Elevation of temperature greater than 100F.  Call for an appointment in one week.  Post Anesthesia Home Care Instructions  Activity: Get plenty of rest for the remainder of the day. A responsible adult should stay with you for 24 hours following the procedure.  For the next 24 hours, DO NOT: -Drive a car -Paediatric nurse -Drink alcoholic beverages -Take any medication unless instructed by your physician -Make any legal decisions or sign important papers.  Meals: Start with liquid foods such as gelatin or soup. Progress to regular foods as tolerated. Avoid greasy, spicy, heavy foods. If nausea and/or vomiting occur, drink only clear liquids until the nausea and/or vomiting subsides. Call your physician if vomiting continues.  Special  Instructions/Symptoms: Your throat may feel dry or sore from the anesthesia or the breathing tube placed in your throat during surgery. If this causes discomfort, gargle with warm salt water. The discomfort should disappear within 24 hours.  If you had a scopolamine patch placed behind your ear for the management of post- operative nausea and/or vomiting:  1. The medication in the patch is effective for 72 hours, after which it should be removed.  Wrap patch in a tissue and discard in the trash. Wash hands thoroughly with soap and water. 2. You may remove the patch earlier than 72 hours if you experience unpleasant side effects which may include dry mouth, dizziness or visual disturbances. 3. Avoid touching the patch. Wash your hands with soap and water after contact with the patch.

## 2017-02-16 NOTE — Op Note (Signed)
   Operative Note  02/16/2017  2:00 PM  PATIENT:  Sandy White  56 y.o. female  PRE-OPERATIVE DIAGNOSIS:  endometrial polyp, anemia  POST-OPERATIVE DIAGNOSIS:  endometrial polyp, anemia  PROCEDURE:  Procedure(s): DILATATION & CURETTAGE/HYSTEROSCOPY WITH MYOSURE EXCISION OF SKIN TAG  SURGEON:  Surgeon(s): Terrance Mass, MD  ANESTHESIA:   general  FINDINGS:Anterior uterine multlobulated polyp. Arcuate uterus? Tubal ostia identified  DESCRIPTION OF OPERATION:FINDINGS: The patient was taken to the operating room where she underwent a successful general endotracheal anesthesia. Patient had PAS stockings for DVT prophylaxis. She received 2 g of Ancef preop. Time out was undertaken to properly identify the patient and the proper operation schedule. The vagina and perineum were prepped and draped in usual sterile fashion. A red rubber Quentin Cornwall was inserted to empty the bladder is content for approximately  30 cc. Bimanual examination demonstrated an anteverted uterus. Patient's legs were in the high lithotomy position. A weighted speculum was placed in the posterior vaginal vault. A single-tooth tenaculum was placed on the anterior cervical lip.Lidocaine injected on four quadrants for total of 10 cc's.  The uterus sounded to 7-1/2 cm. Pratt dilator were used to dilate the cervical canal to a  21  mm size. The Hologic Myosure resectoscopic morcellator with a scope of 6.25 mm and an operating blade of 3.0 mm was introduced into the intrauterine cavity. 0.9% normal saline was the distending media. Inspection of the endometrial cavity demonstrated an anterior uterine multi lobulated polyp. With the resectoscopic morcellator they were removed and submitted for histological evaluation. Fluid deficit was approximately 150  cc.  Attention was then made to the left medial thigh skin tag. With a marking an elliptical  Marking was made at the base. The lidocaine was injected subdermally for a total of 4  cc's. With the scalpel the large skin tag was removed. Bovie cautery with fine tip used for hemostasis at the remaining base. The specimen was passed of for pathologically evaluation. The skin edges were re approximated with interrupted  1-0 vicryl.    ESTIMATED BLOOD LOSS: minimal   Intake/Output Summary (Last 24 hours) at 02/16/17 1400 Last data filed at 02/16/17 1351  Gross per 24 hour  Intake              700 ml  Output                5 ml  Net              695 ml     BLOOD ADMINISTERED:none   LOCAL MEDICATIONS USED:  LIDOCAINE 1 % with 1:100,000 epi. 15 cc's total  SPECIMEN:  Source of Specimen:  Endometrial polyps and left thigh lipoma/skin tag  DISPOSITION OF SPECIMEN:  PATHOLOGY  COUNTS:  YES  PLAN OF CARE: Transfer to [facility] HMD2:00 PMTD@

## 2017-02-16 NOTE — Anesthesia Procedure Notes (Signed)
Procedure Name: LMA Insertion Date/Time: 02/16/2017 1:06 PM Performed by: Bethena Roys T Pre-anesthesia Checklist: Patient identified, Emergency Drugs available, Suction available and Patient being monitored Patient Re-evaluated:Patient Re-evaluated prior to inductionOxygen Delivery Method: Circle system utilized Preoxygenation: Pre-oxygenation with 100% oxygen Intubation Type: IV induction Ventilation: Mask ventilation without difficulty LMA: LMA inserted LMA Size: 4.0 Number of attempts: 1 Airway Equipment and Method: Bite block Placement Confirmation: positive ETCO2 Tube secured with: Tape Dental Injury: Teeth and Oropharynx as per pre-operative assessment

## 2017-02-16 NOTE — Interval H&P Note (Signed)
History and Physical Interval Note:  02/16/2017 12:47 PM  Sandy White  has presented today for surgery, with the diagnosis of endometrial polyp, anemia  The various methods of treatment have been discussed with the patient and family. After consideration of risks, benefits and other options for treatment, the patient has consented to  Procedure(s) with comments: Agency (N/A) - request 1:00pm OR time  Requesting one hour OR time  Dr. Moshe Salisbury will bring the Mirena IUD with him. INTRAUTERINE DEVICE (IUD) INSERTION (N/A) - Dr. Moshe Salisbury will bring the Mirena IUD with him from our office. as a surgical intervention .  The patient's history has been reviewed, patient examined, no change in status, stable for surgery.  I have reviewed the patient's chart and labs.  Questions were answered to the patient's satisfaction.    IUD will not be paced patient has changed her mind but would like to have her left thigh skin tag removed Sandy White

## 2017-02-16 NOTE — Anesthesia Preprocedure Evaluation (Signed)
Anesthesia Evaluation  Patient identified by MRN, date of birth, ID band Patient awake    Reviewed: Allergy & Precautions, NPO status , Patient's Chart, lab work & pertinent test results  Airway Mallampati: II  TM Distance: >3 FB Neck ROM: Full    Dental no notable dental hx.    Pulmonary neg pulmonary ROS,    Pulmonary exam normal breath sounds clear to auscultation       Cardiovascular negative cardio ROS Normal cardiovascular exam Rhythm:Regular Rate:Normal     Neuro/Psych negative neurological ROS  negative psych ROS   GI/Hepatic negative GI ROS, Neg liver ROS,   Endo/Other  Hypothyroidism   Renal/GU negative Renal ROS  negative genitourinary   Musculoskeletal negative musculoskeletal ROS (+)   Abdominal   Peds negative pediatric ROS (+)  Hematology  (+) anemia ,   Anesthesia Other Findings   Reproductive/Obstetrics negative OB ROS                             Anesthesia Physical Anesthesia Plan  ASA: II  Anesthesia Plan: General   Post-op Pain Management:    Induction: Intravenous  Airway Management Planned: LMA  Additional Equipment:   Intra-op Plan:   Post-operative Plan: Extubation in OR  Informed Consent: I have reviewed the patients History and Physical, chart, labs and discussed the procedure including the risks, benefits and alternatives for the proposed anesthesia with the patient or authorized representative who has indicated his/her understanding and acceptance.   Dental advisory given  Plan Discussed with: CRNA and Surgeon  Anesthesia Plan Comments:         Anesthesia Quick Evaluation

## 2017-02-16 NOTE — H&P (View-Only) (Signed)
Sandy White is an 56 y.o. female for preop exam. Patient scheduled for resectoscopic polypectomy this Thursday. Her history is as follows:  Patient is a 56 year old gravida 4 para 4 (first 3 pregnancies were vaginal delivery the fourth one was a cesarean section along with tubal ligation). who has not been seen the office since 2016. Patient is here for evaluation with sonohysterogram and endometrial biopsy as a result of her irregular vaginal bleeding. Patient with past history of iron deficiency anemia her PCP sent her to the hospital last week she was transfused 2 units of packed red blood cells due to the fact her hemoglobin was 6.8 upon completion of transfusion her hemoglobin was 9.0 she's currently on iron sulfate one tablet twice a day. She called the office side called in a prescription for Megace 40 mg twice a day to help stop her bleeding to the evaluation today.  Patient had an ultrasound 2016 which demonstrated the following: Ultrasound reviewed from 10/12/2011 had demonstrated she had a uterus that measured 10.6 x 7.8 x 6.1 cm with endometrial stripe 11.3 mm and she had several intramural myomas the largest one measuring 21 x 15 mm and was calcified. They described prominent endometrial cavity with 2 echogenic foci 18 x 9 mm, and 7 x 11 mm with color flow to the area. She had a right corpus luteum cyst left ovary was normal.   Patient also had an endometrial biopsy which demonstrated the following: Diagnosis Endometrium, biopsy, uterus - BENIGN ENDOMETRIAL POLYP IN A SETTING OF PROLIFERATIVE ENDOMETRIUM. - NO ENDOMETRIAL HYPERPLASIA, ATYPIA OR MALIGNANCY IDENTIFIED.  That same office visit in 2016 she had a sonohysterogram and no intracavitary defects she was once again noted to have several fibroids the largest one measuring 19 x 19 x 25 mm the ovaries were normal. Endometrial stripe and was 7.5 mm.   Ultrasound/sono hysterogram 02/06/2017: Uterus measured 11.1 x 7.8 x  6.7 cm patient with 5 fibroids the largest one measuring 20 x 28 mm. Right ovary paraovarian cyst measuring 14 x 16 mm. Left ovary a thin-walled echo-free cyst measuring 36 x 21 x 31 mm. Thickened endometrium with an echogenic focus measuring 23 x 14 mm was noted negative color flow. Questionable arcuate uterus although by CT scan a few years ago there was no report of this. The cervix is then cleansed with Betadine solution a sterile catheter was introduced into the uterine cavity to endometrial polyps were noted one measuring 23 x 29 x 12 mm, the second 1 19 x 15 mm was noted.    Pertinent Gynecological History: Menses: DUB Bleeding: dysfunctional uterine bleeding Contraception: tubal ligation DES exposure: unknown Blood transfusions: none Sexually transmitted diseases: no past history Previous GYN Procedures: 3 NSVD, 1 csect  Last mammogram: normal Date: 2017 Last pap: abnormal: ASCUS neg HPV Date: 2018  OB History: G4, P4   Menstrual History: Menarche age: 68 Patient's last menstrual period was 12/15/2016 (approximate).    Past Medical History:  Diagnosis Date  . Anemia due to chronic blood loss    heavy uterine bleeding ---  02-03-2017  transfused 2 units RBS's for HG 6.8  . Constipation    due to iron supplement  . DUB (dysfunctional uterine bleeding)   . Endometrial polyp   . GERD (gastroesophageal reflux disease)   . History of basal cell carcinoma excision   . 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 Hypothyroidism   . Migraine   . Thyroid nodule    per pt benign biospy  . Uterine fibroid   . Wears glasses     Past Surgical History:  Procedure Laterality Date  . APPENDECTOMY  age 89  . CESAREAN SECTION W/BTL  10/27/2005  . EXCISIONAL BIOPSY RIGHT BREAST MASS  06/2013   atypical ductal hyperplasia  . TONSILLECTOMY  age 25    . WIDE LOCAL EXCISION MELANOMA RIGHT UPPER SHOULDER REGION/  LYMPH NODE DISSECTION'S/  SKIN GRAFT  09/ 2013   [facility]  . WISDOM TOOTH EXTRACTION  1990's    Family History  Problem Relation Age of Onset  . Breast cancer Mother   . Heart disease Father   . Breast cancer Sister     had doublem mastectomy  . Breast cancer Maternal Grandmother   . Breast cancer Sister     Social History:  reports that she has never smoked. She has never used smokeless tobacco. She reports that she does not drink alcohol or use drugs.  Allergies:  Allergies  Allergen Reactions  . Codeine Nausea And Vomiting    Severe n/v and severe headache     (Not in a hospital admission)  REVIEW OF SYSTEMS: A ROS was performed and pertinent positives and negatives are included in the history.  GENERAL: No fevers or chills. HEENT: No change in vision, no earache, sore throat or sinus congestion. NECK: No pain or stiffness. CARDIOVASCULAR: No chest pain or pressure. No palpitations. PULMONARY: No shortness of breath, cough or wheeze. GASTROINTESTINAL: No abdominal pain, nausea, vomiting or diarrhea, melena or bright red blood per rectum. GENITOURINARY: No urinary frequency, urgency, hesitancy or dysuria. MUSCULOSKELETAL: No joint or muscle pain, no back pain, no recent trauma. DERMATOLOGIC: No rash, no itching, no lesions. ENDOCRINE: No polyuria, polydipsia, no heat or cold intolerance. No recent change in weight. HEMATOLOGICAL: No anemia or easy bruising or bleeding. NEUROLOGIC: No headache, seizures, numbness, tingling or weakness. PSYCHIATRIC: No depression, no loss of interest in normal activity or change in sleep pattern.     Blood pressure 124/80, height 5\' 3"  (1.6 m), weight 135 lb (61.2 kg), last menstrual period 12/15/2016.  Physical Exam:  HEENT:unremarkable Neck:Supple, midline, no thyroid megaly, no carotid bruits Lungs:  Clear to auscultation no rhonchi's or wheezes Heart:Regular rate and rhythm, no  murmurs or gallops Breast Exam: Symmetrical and appears no palpable mass or tenderness no supra clavicular axillary lymphadenopathy Abdomen: Soft nontender no rebound or guarding Pelvic:BUS within normal limits Vagina: No lesions or discharge Cervix: No lesions or discharge Uterus: Anteverted mobile nontender Adnexa: No palpable masses or tenderness Extremities: No cords, no edema Rectal: Not examined   Assessment/Plan:  Patient with fibroid uterus dysfunctional bleeding and endometrial polyp. She is scheduled to undergo resectoscopic polypectomy this Thursday. Originally she was to have removed  the polyps as well as  placement a Mirena IUD. Patient decided against the Mirena IUD and wait to see if she continues to bleed after the polyps are removed. If she begins to bleed afterwards she states that at that time she may decide to put the Mirena IUD and if that fails she would then proceed  with hysterectomy. Meanwhile she'll continue her iron tablet twice a day and I have asked to continue her Megace 40 mg twice a day until 1 week postop. The risks and benefits from surgery as discussed below:  Patient was counseled as to the risk of surgery to include the following:  1. Infection (prohylactic antibiotics will be administered)  2. DVT/Pulmonary Embolism (prophylactic pneumo compression stockings will be used)  3.Trauma to internal organs requiring additional surgical procedure to repair any injury to     Internal organs requiring perhaps additional hospitalization days.  4.Hemmorhage requiring transfusion and blood products which carry risks such as  anaphylactic reaction, hepatitis and AIDS  Patient had received literature information on the procedure scheduled and all her questions were answered and fully accepts all risk.   [facility] HMD2:43 PMTD     Terrance Mass 02/14/2017, 2:14 PM

## 2017-02-16 NOTE — Transfer of Care (Signed)
Immediate Anesthesia Transfer of Care Note  Patient: Sandy White  Procedure(s) Performed: Procedure(s) with comments: DILATATION & CURETTAGE/HYSTEROSCOPY WITH MYOSURE (N/A) - request 1:00pm OR time  Requesting one hour OR time  Dr. Moshe Salisbury will bring the Mirena IUD with him. EXCISION OF SKIN TAG (Left)  Patient Location: PACU  Anesthesia Type:General  Level of Consciousness: awake, alert  and oriented  Airway & Oxygen Therapy: Patient Spontanous Breathing and Patient connected to nasal cannula oxygen  Post-op Assessment: Report given to RN  Post vital signs: Reviewed and stable  Last Vitals:  Vitals:   02/16/17 1128 02/16/17 1350  BP: 121/69 (P) 106/62  Pulse: (!) 57 71  Resp: 16   Temp: 36.8 C 36.8 C    Last Pain:  Vitals:   02/16/17 1128  TempSrc: Oral      Patients Stated Pain Goal: 7 ([id number])  Complications: No apparent anesthesia complications

## 2017-02-16 NOTE — Anesthesia Postprocedure Evaluation (Signed)
Anesthesia Post Note  Patient: Sandy White  Procedure(s) Performed: Procedure(s) (LRB): DILATATION & CURETTAGE/HYSTEROSCOPY WITH MYOSURE (N/A) EXCISION OF SKIN TAG (Left)  Patient location during evaluation: PACU Anesthesia Type: General Level of consciousness: awake and alert Pain management: pain level controlled Vital Signs Assessment: post-procedure vital signs reviewed and stable Respiratory status: spontaneous breathing, nonlabored ventilation, respiratory function stable and patient connected to nasal cannula oxygen Cardiovascular status: blood pressure returned to baseline and stable Postop Assessment: no signs of nausea or vomiting Anesthetic complications: no       Last Vitals:  Vitals:   02/16/17 1350 02/16/17 1400  BP: 106/62 108/64  Pulse: 71 69  Resp: 17 13  Temp: 36.8 C     Last Pain:  Vitals:   02/16/17 1128  TempSrc: Oral                 [name]

## 2017-02-17 ENCOUNTER — Encounter (HOSPITAL_BASED_OUTPATIENT_CLINIC_OR_DEPARTMENT_OTHER): Payer: Self-pay | Admitting: Gynecology

## 2017-03-01 ENCOUNTER — Telehealth: Payer: Self-pay | Admitting: *Deleted

## 2017-03-01 MED ORDER — MEGESTROL ACETATE 40 MG PO TABS: ORAL_TABLET | ORAL | 0 refills | Status: DC

## 2017-03-01 NOTE — Telephone Encounter (Signed)
Pt called post D&C hysteroscopy on 02/16/17 called today at 3:30pm c/o heavy bleeding and cramping changing pad every 1 hour, states heavy bleeding started on 02/24/17, I explained to pt she should have called on Monday due to amount of bleeding. She asked if you wanted her to continue taking megace? States she was taking megace prior to surgery. She has not megace now, she lives in [place] will take her 45-50 minutes to get to office. Has appointment scheduled on this friday for post-op. I told pt I would relay this information to you and schedule OV tomorrow due to travel time as it will almost take pt an hour to get her. If you are okay with scheduling OV tomorrow, should pt have Rx for megace as well? Please advise

## 2017-03-01 NOTE — Telephone Encounter (Signed)
Call in prescription for Megace 40 mg take 1 pill 3 times a day for the next 3 days then twice a day for the next 10 days. We will see her tomorrow or Friday

## 2017-03-01 NOTE — Telephone Encounter (Signed)
Pt informed, Rx sent. 

## 2017-03-03 ENCOUNTER — Ambulatory Visit (INDEPENDENT_AMBULATORY_CARE_PROVIDER_SITE_OTHER): Payer: BC Managed Care – PPO | Admitting: Gynecology

## 2017-03-03 ENCOUNTER — Encounter: Payer: Self-pay | Admitting: Gynecology

## 2017-03-03 VITALS — BP 120/64

## 2017-03-03 DIAGNOSIS — N938 Other specified abnormal uterine and vaginal bleeding: Secondary | ICD-10-CM

## 2017-03-03 DIAGNOSIS — R102 Pelvic and perineal pain: Secondary | ICD-10-CM

## 2017-03-03 DIAGNOSIS — Z09 Encounter for follow-up examination after completed treatment for conditions other than malignant neoplasm: Secondary | ICD-10-CM

## 2017-03-03 MED ORDER — KETOROLAC TROMETHAMINE 30 MG/ML IJ SOLN
30.0000 mg | Freq: Once | INTRAMUSCULAR | Status: AC
  Administered 2017-03-03: 30 mg via INTRAVENOUS

## 2017-03-03 MED ORDER — DOXYCYCLINE HYCLATE 100 MG PO CAPS: 100.0000 mg | ORAL_CAPSULE | Freq: Two times a day (BID) | ORAL | 0 refills | Status: DC

## 2017-03-03 MED ORDER — KETOROLAC TROMETHAMINE 10 MG PO TABS: 10.0000 mg | ORAL_TABLET | Freq: Four times a day (QID) | ORAL | 0 refills | Status: DC | PRN

## 2017-03-03 NOTE — Progress Notes (Signed)
   Patient is a 56 year old who presented to the office for her two-week postop visit is complaining of continued to bleed vaginally postop. She was started on Megace 40 mg one by mouth 3 times a day for 2 days and then 1 twice a day for the next 10 days which she's currently on her second day. She's also complaining of some low abdominal cramping. Findings from her surgery to include pictures and pathology report discussed as follows:  On 02/16/2018 patient underwent a resectoscopic polypectomy as well as excision of left medial thigh skin tag.   FINDINGS:Anterior uterine multlobulated polyp. Arcuate uterus? Tubal ostia identified. Left medial thigh skin tag.  Pathology report: Diagnosis 1. Polyp, uterine - BENIGN POLYPOID ENDOMETRIAL TISSUE WITH PROGESTATIONAL EFFECT. - ASSOCIATED BENIGN MYOMETRIUM PRESENT. - NO HYPERPLASIA, ATYPIA, OR MALIGNANCY IDENTIFIED. - SEE COMMENT. 2. Soft tissue, biopsy, left medial thigh - INTRADERMAL NEVUS. - NO ATYPIA OR MALIGNANCY IDENTIFIED. Microscopic Comment 1. Sections from the first specimen demonstrate benign polypoid endometrial tissue with progestational effect. Although definitive features of a polyp are not identified, the findings may represent a functional-type endometrial polyp. Please correlate with clinical and operative impression.  Exam: Abdomen: Soft nontender no rebound or guarding Pelvic exam there was some blood noted on her medial thighs. Left medial thigh suture was removed from the excision of the intradermal nevus that was excised. Healing well. Pelvic exam vaginal blood was present Cervix blood clot was present Bimanual exam uterus anteverted normal size shape and consistency Adnexa: No palpable masses or tenderness Rectal exam not done  The cervix was cleansed with Betadine solution and a Vabra Aspiration was performed to remove all the remaining clots and tissue from the uterine cavity and the specimen submitted for  histological evaluation.  Due to patient's cramping she received Toradol 30 mg IM and I prescribed her Toradol 10 mg to take 1 by mouth every 6 hours when necessary for the next 5 days.  Patient to continue her Megace 40 mg twice a day for the next 10 days. In the event of endometritis I prescribed her Vibramycin 100 mg take 1 by mouth twice a day for 7 days. Patient to return to the office in 2 weeks for follow-up. Pathology report from today pending.

## 2017-03-03 NOTE — Addendum Note (Signed)
Addended by: Alen Blew on: 03/03/2017 04:22 PM   Modules accepted: Orders

## 2017-03-07 LAB — PATHOLOGY

## 2017-03-20 ENCOUNTER — Ambulatory Visit (INDEPENDENT_AMBULATORY_CARE_PROVIDER_SITE_OTHER): Payer: BC Managed Care – PPO | Admitting: Gynecology

## 2017-03-20 ENCOUNTER — Encounter: Payer: Self-pay | Admitting: Pediatrics

## 2017-03-20 ENCOUNTER — Encounter: Payer: Self-pay | Admitting: Gynecology

## 2017-03-20 VITALS — BP 118/72 | Ht 63.0 in | Wt 141.0 lb

## 2017-03-20 DIAGNOSIS — Z30430 Encounter for insertion of intrauterine contraceptive device: Secondary | ICD-10-CM | POA: Diagnosis not present

## 2017-03-20 DIAGNOSIS — D500 Iron deficiency anemia secondary to blood loss (chronic): Secondary | ICD-10-CM

## 2017-03-20 DIAGNOSIS — N921 Excessive and frequent menstruation with irregular cycle: Secondary | ICD-10-CM | POA: Diagnosis not present

## 2017-03-20 DIAGNOSIS — Z975 Presence of (intrauterine) contraceptive device: Secondary | ICD-10-CM | POA: Insufficient documentation

## 2017-03-20 NOTE — Patient Instructions (Signed)

## 2017-03-20 NOTE — Progress Notes (Signed)
   Patient is a 37 that presents to the office now 4 weeks status post resectoscopic polypectomy as a result of heavy irregular vaginal bleeding. Her pathology report from her surgery demonstrated the following:  Pathology report: Diagnosis 1. Polyp, uterine - BENIGN POLYPOID ENDOMETRIAL TISSUE WITH PROGESTATIONAL EFFECT. - ASSOCIATED BENIGN MYOMETRIUM PRESENT. - NO HYPERPLASIA, ATYPIA, OR MALIGNANCY IDENTIFIED. - SEE COMMENT. 2. Soft tissue, biopsy, left medial thigh - INTRADERMAL NEVUS. - NO ATYPIA OR MALIGNANCY IDENTIFIED. Microscopic Comment 1. Sections from the first specimen demonstrate benign polypoid endometrial tissue with progestational effect. Although definitive features of a polyp are not identified, the findings may represent a functional-type endometrial polyp. Please correlate with clinical and operative impression.  She had been placed on Megace 40 mg twice a day postop to help with her bleeding. She was seen in the office on March 16 and had Vabra Aspiration of blood clots and tissue to help with her bleeding and was given Toradol 30 mg IM and Toradol 10 Mg Every 6 Hours for 5 Days and in the Event of an Endometritis She Was Placed on Vibramycin 100 Mg Twice a Day for 7 Days. She Was to Continue the Megace to Complete 10 Days of 40 Mg Twice a Day. Her Pathology Report from That Visit Demonstrated the Following:  Blood Admixed with Multiple Small Fragments of Benign Underdeveloped Secretory Consistent with the Effect of Exogenous Hormone Therapy. No Hyperplasia or Atypia or Malignancy Was Identified.  Patient Had Been Adamant about Definitive Surgery Such As a Hysterectomy and This Was the Reason We Did the Resectoscopic Polypectomy. Now That She Has Continued to Bleed We Had Discussed the Possibility of Placing a Mirena IUD to see how she responds over the course of the next 3 months. 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 She is anemic and is currently taking her iron supplementation. She is no longer having any cramping.                                                                    IUD procedure note       Patient presented to the office today for placement of Mirena IUD. The patient had previously been provided with literature information on this method of contraception. The risks benefits and pros and cons were discussed and all her questions were answered. She is fully aware that this form of contraception is 99% effective and is good for 5 years.  Pelvic exam: Bartholin urethra Skene glands: Within normal limits Vagina: No lesions or discharge Cervix: No lesions or discharge Uterus: Anteverted position Adnexa: No masses or tenderness Rectal exam: Not done  The cervix was cleansed with Betadine solution. A single-tooth tenaculum was placed on the anterior cervical lip. The uterus sounded to 9 centimeter. The IUD was shown to the patient and inserted in a sterile fashion. The IUD string was trimmed. The single-tooth tenaculum was removed. Patient was instructed to return back to the office in one month for follow up.        Lot number [id number]

## 2017-03-20 NOTE — Telephone Encounter (Signed)
This encounter was created in error - please disregard.

## 2017-03-21 ENCOUNTER — Telehealth: Payer: Self-pay | Admitting: *Deleted

## 2017-03-21 MED ORDER — MEGESTROL ACETATE 40 MG PO TABS: 40.0000 mg | ORAL_TABLET | Freq: Two times a day (BID) | ORAL | 0 refills | Status: DC

## 2017-03-21 NOTE — Telephone Encounter (Signed)
We placed the Mirena IUD yesterday but we can go ahead and call in Megace 40 MG BID for 1 week

## 2017-03-21 NOTE — Telephone Encounter (Signed)
Left a detailed message on pt voicemail, Rx has been sent.

## 2017-03-21 NOTE — Telephone Encounter (Signed)
Pt had office visit yesterday for menorrhagia with irregular cycle thought  at Rx for megace was going to be sent to pharmacy. Pt said pharmacy has no Rx there. Should pt have Rx for megace? Please advise

## 2017-03-23 ENCOUNTER — Encounter: Payer: Self-pay | Admitting: Anesthesiology

## 2017-04-18 ENCOUNTER — Ambulatory Visit: Payer: BC Managed Care – PPO | Admitting: Gynecology

## 2017-04-28 ENCOUNTER — Ambulatory Visit: Payer: BC Managed Care – PPO | Admitting: Gynecology

## 2017-04-28 DIAGNOSIS — Z0289 Encounter for other administrative examinations: Secondary | ICD-10-CM

## 2017-05-03 ENCOUNTER — Encounter: Payer: Self-pay | Admitting: Gynecology

## 2017-05-05 ENCOUNTER — Encounter: Payer: Self-pay | Admitting: Gynecology

## 2017-05-05 ENCOUNTER — Ambulatory Visit (INDEPENDENT_AMBULATORY_CARE_PROVIDER_SITE_OTHER): Payer: BC Managed Care – PPO | Admitting: Gynecology

## 2017-05-05 VITALS — BP 110/78

## 2017-05-05 DIAGNOSIS — D500 Iron deficiency anemia secondary to blood loss (chronic): Secondary | ICD-10-CM | POA: Diagnosis not present

## 2017-05-05 DIAGNOSIS — Z30431 Encounter for routine checking of intrauterine contraceptive device: Secondary | ICD-10-CM

## 2017-05-05 DIAGNOSIS — N898 Other specified noninflammatory disorders of vagina: Secondary | ICD-10-CM

## 2017-05-05 DIAGNOSIS — R350 Frequency of micturition: Secondary | ICD-10-CM

## 2017-05-05 LAB — WET PREP FOR TRICH, YEAST, CLUE
Clue Cells Wet Prep HPF POC: NONE SEEN
TRICH WET PREP: NONE SEEN

## 2017-05-05 LAB — CBC WITH DIFFERENTIAL/PLATELET
Basophils Absolute: 0 cells/uL (ref 0–200)
Basophils Relative: 0 %
EOS PCT: 2 %
Eosinophils Absolute: 162 cells/uL (ref 15–500)
HCT: 41.1 % (ref 35.0–45.0)
Hemoglobin: 13.4 g/dL (ref 11.7–15.5)
Lymphocytes Relative: 25 %
Lymphs Abs: 2025 cells/uL (ref 850–3900)
MCH: 28.5 pg (ref 27.0–33.0)
MCHC: 32.6 g/dL (ref 32.0–36.0)
MCV: 87.3 fL (ref 80.0–100.0)
MPV: 9.3 fL (ref 7.5–12.5)
Monocytes Absolute: 729 cells/uL (ref 200–950)
Monocytes Relative: 9 %
NEUTROS ABS: 5184 {cells}/uL (ref 1500–7800)
NEUTROS PCT: 64 %
Platelets: 265 10*3/uL (ref 140–400)
RBC: 4.71 MIL/uL (ref 3.80–5.10)
RDW: 16 % — ABNORMAL HIGH (ref 11.0–15.0)
WBC: 8.1 10*3/uL (ref 3.8–10.8)

## 2017-05-05 LAB — URINALYSIS W MICROSCOPIC + REFLEX CULTURE
Bilirubin Urine: NEGATIVE
Casts: NONE SEEN [LPF]
Crystals: NONE SEEN [HPF]
GLUCOSE, UA: NEGATIVE
Ketones, ur: NEGATIVE
LEUKOCYTES UA: NEGATIVE
Nitrite: NEGATIVE
PH: 6 (ref 5.0–8.0)
Protein, ur: NEGATIVE
RBC / HPF: NONE SEEN RBC/HPF (ref <=2)
Specific Gravity, Urine: 1.025 (ref 1.001–1.035)

## 2017-05-05 MED ORDER — PHENAZOPYRIDINE HCL 200 MG PO TABS: 200.0000 mg | ORAL_TABLET | Freq: Three times a day (TID) | ORAL | 0 refills | Status: DC | PRN

## 2017-05-05 MED ORDER — FLUCONAZOLE 150 MG PO TABS: 150.0000 mg | ORAL_TABLET | Freq: Once | ORAL | 0 refills | Status: AC

## 2017-05-05 NOTE — Patient Instructions (Signed)
Phenazopyridine tablets What is this medicine? PHENAZOPYRIDINE (fen az oh PEER i deen) is a pain reliever. It is used to stop the pain, burning, or discomfort caused by infection or irritation of the urinary tract. This medicine is not an antibiotic. It will not cure a urinary tract infection. This medicine may be used for other purposes; ask your health care provider or pharmacist if you have questions. COMMON BRAND NAME(S): AZO, Azo-100, Azo-Gesic, Azo-Septic, Azo-Standard, Phenazo, Prodium, Pyridium, Urinary Analgesic, Uristat What should I tell my health care provider before I take this medicine? They need to know if you have any of these conditions: -glucose-6-phosphate dehydrogenase (G6PD) deficiency -kidney disease -an unusual or allergic reaction to phenazopyridine, other medicines, foods, dyes, or preservatives -pregnant or trying to get pregnant -breast-feeding How should I use this medicine? Take this medicine by mouth with a glass of water. Follow the directions on the prescription label. Take after meals. Take your doses at regular intervals. Do not take your medicine more often than directed. Do not skip doses or stop your medicine early even if you feel better. Do not stop taking except on your doctor's advice. Talk to your pediatrician regarding the use of this medicine in children. Special care may be needed. Overdosage: If you think you have taken too much of this medicine contact a poison control center or emergency room at once. NOTE: This medicine is only for you. Do not share this medicine with others. What if I miss a dose? If you miss a dose, take it as soon as you can. If it is almost time for your next dose, take only that dose. Do not take double or extra doses. What may interact with this medicine? Interactions are not expected. This list may not describe all possible interactions. Give your health care provider a list of all the medicines, herbs, non-prescription  drugs, or dietary supplements you use. Also tell them if you smoke, drink alcohol, or use illegal drugs. Some items may interact with your medicine. What should I watch for while using this medicine? Tell your doctor or health care professional if your symptoms do not improve or if they get worse. This medicine colors body fluids red. This effect is harmless and will go away after you are done taking the medicine. It will change urine to an dark orange or red color. The red color may stain clothing. Soft contact lenses may become permanently stained. It is best not to wear soft contact lenses while taking this medicine. If you are diabetic you may get a false positive result for sugar in your urine. Talk to your health care provider. What side effects may I notice from receiving this medicine? Side effects that you should report to your doctor or health care professional as soon as possible: -allergic reactions like skin rash, itching or hives, swelling of the face, lips, or tongue -blue or purple color of the skin -difficulty breathing -fever -less urine -unusual bleeding, bruising -unusual tired, weak -vomiting -yellowing of the eyes or skin Side effects that usually do not require medical attention (report to your doctor or health care professional if they continue or are bothersome): -dark urine -headache -stomach upset This list may not describe all possible side effects. Call your doctor for medical advice about side effects. You may report side effects to FDA at 1-800-FDA-1088. Where should I keep my medicine? Keep out of the reach of children. Store at room temperature between 15 and 30 degrees C (59 and 86   degrees F). Protect from light and moisture. Throw away any unused medicine after the expiration date. NOTE: This sheet is a summary. It may not cover all possible information. If you have questions about this medicine, talk to your doctor, pharmacist, or health care provider.   2018 Elsevier/Gold Standard (2008-07-03 11:04:07) Fluconazole tablets What is this medicine? FLUCONAZOLE (floo KON na zole) is an antifungal medicine. It is used to treat certain kinds of fungal or yeast infections. This medicine may be used for other purposes; ask your health care provider or pharmacist if you have questions. COMMON BRAND NAME(S): Diflucan What should I tell my health care provider before I take this medicine? They need to know if you have any of these conditions: -history of irregular heart beat -kidney disease -an unusual or allergic reaction to fluconazole, other azole antifungals, medicines, foods, dyes, or preservatives -pregnant or trying to get pregnant -breast-feeding How should I use this medicine? Take this medicine by mouth. Follow the directions on the prescription label. Do not take your medicine more often than directed. Talk to your pediatrician regarding the use of this medicine in children. Special care may be needed. This medicine has been used in children as young as 56 months of age. Overdosage: If you think you have taken too much of this medicine contact a poison control center or emergency room at once. NOTE: This medicine is only for you. Do not share this medicine with others. What if I miss a dose? If you miss a dose, take it as soon as you can. If it is almost time for your next dose, take only that dose. Do not take double or extra doses. What may interact with this medicine? Do not take this medicine with any of the following medications: -astemizole -certain medicines for irregular heart beat like dofetilide, dronedarone, quinidine -cisapride -erythromycin -lomitapide -other medicines that prolong the QT interval (cause an abnormal heart rhythm) -pimozide -terfenadine -thioridazine -tolvaptan -ziprasidone This medicine may also interact with the following medications: -antiviral medicines for HIV or AIDS -birth control pills -certain  antibiotics like rifabutin, rifampin -certain medicines for blood pressure like amlodipine, isradipine, felodipine, hydrochlorothiazide, losartan, nifedipine -certain medicines for cancer like cyclophosphamide, vinblastine, vincristine -certain medicines for cholesterol like atorvastatin, lovastatin, fluvastatin, simvastatin -certain medicines for depression, anxiety, or psychotic disturbances like amitriptyline, midazolam, nortriptyline, triazolam -certain medicines for diabetes like glipizide, glyburide, tolbutamide -certain medicines for pain like alfentanil, fentanyl, methadone -certain medicines for seizures like carbamazepine, phenytoin -certain medicines that treat or prevent blood clots like warfarin -halofantrine -medicines that lower your chance of fighting infection like cyclosporine, prednisone, tacrolimus -NSAIDS, medicines for pain and inflammation, like celecoxib, diclofenac, flurbiprofen, ibuprofen, meloxicam, naproxen -other medicines for fungal infections -sirolimus -theophylline -tofacitinib This list may not describe all possible interactions. Give your health care provider a list of all the medicines, herbs, non-prescription drugs, or dietary supplements you use. Also tell them if you smoke, drink alcohol, or use illegal drugs. Some items may interact with your medicine. What should I watch for while using this medicine? Visit your doctor or health care professional for regular checkups. If you are taking this medicine for a long time you may need blood work. Tell your doctor if your symptoms do not improve. Some fungal infections need many weeks or months of treatment to cure. Alcohol can increase possible damage to your liver. Avoid alcoholic drinks. If you have a vaginal infection, do not have sex until you have finished your treatment. You can wear a  sanitary napkin. Do not use tampons. Wear freshly washed cotton, not synthetic, panties. What side effects may I notice  from receiving this medicine? Side effects that you should report to your doctor or health care professional as soon as possible: -allergic reactions like skin rash or itching, hives, swelling of the lips, mouth, tongue, or throat -dark urine -feeling dizzy or faint -irregular heartbeat or chest pain -redness, blistering, peeling or loosening of the skin, including inside the mouth -trouble breathing -unusual bruising or bleeding -vomiting -yellowing of the eyes or skin Side effects that usually do not require medical attention (report to your doctor or health care professional if they continue or are bothersome): -changes in how food tastes -diarrhea -headache -stomach upset or nausea This list may not describe all possible side effects. Call your doctor for medical advice about side effects. You may report side effects to FDA at 1-800-FDA-1088. Where should I keep my medicine? Keep out of the reach of children. Store at room temperature below 30 degrees C (86 degrees F). Throw away any medicine after the expiration date. NOTE: This sheet is a summary. It may not cover all possible information. If you have questions about this medicine, talk to your doctor, pharmacist, or health care provider.  2018 Elsevier/Gold Standard (2013-07-13 19:37:38) Vaginal Yeast infection, Adult Vaginal yeast infection is a condition that causes soreness, swelling, and redness (inflammation) of the vagina. It also causes vaginal discharge. This is a common condition. Some women get this infection frequently. What are the causes? This condition is caused by a change in the normal balance of the yeast (candida) and bacteria that live in the vagina. This change causes an overgrowth of yeast, which causes the inflammation. What increases the risk? This condition is more likely to develop in:  Women who take antibiotic medicines.  Women who have diabetes.  Women who take birth control pills.  Women who are  pregnant.  Women who douche often.  Women who have a weak defense (immune) system.  Women who have been taking steroid medicines for a long time.  Women who frequently wear tight clothing. What are the signs or symptoms? Symptoms of this condition include:  White, thick vaginal discharge.  Swelling, itching, redness, and irritation of the vagina. The lips of the vagina (vulva) may be affected as well.  Pain or a burning feeling while urinating.  Pain during sex. How is this diagnosed? This condition is diagnosed with a medical history and physical exam. This will include a pelvic exam. Your health care provider will examine a sample of your vaginal discharge under a microscope. Your health care provider may send this sample for testing to confirm the diagnosis. How is this treated? This condition is treated with medicine. Medicines may be over-the-counter or prescription. You may be told to use one or more of the following:  Medicine that is taken orally.  Medicine that is applied as a cream.  Medicine that is inserted directly into the vagina (suppository). Follow these instructions at home:  Take or apply over-the-counter and prescription medicines only as told by your health care provider.  Do not have sex until your health care provider has approved. Tell your sex partner that you have a yeast infection. That person should go to his or her health care provider if he or she develops symptoms.  Do not wear tight clothes, such as pantyhose or tight pants.  Avoid using tampons until your health care provider approves.  Eat more yogurt. This  may help to keep your yeast infection from returning.  Try taking a sitz bath to help with discomfort. This is a warm water bath that is taken while you are sitting down. The water should only come up to your hips and should cover your buttocks. Do this 3-4 times per day or as told by your health care provider.  Do not douche.  Wear  breathable, cotton underwear.  If you have diabetes, keep your blood sugar levels under control. Contact a health care provider if:  You have a fever.  Your symptoms go away and then return.  Your symptoms do not get better with treatment.  Your symptoms get worse.  You have new symptoms.  You develop blisters in or around your vagina.  You have blood coming from your vagina and it is not your menstrual period.  You develop pain in your abdomen. This information is not intended to replace advice given to you by your health care provider. Make sure you discuss any questions you have with your health care provider. Document Released: 09/14/2005 Document Revised: 05/18/2016 Document Reviewed: 06/08/2015 Elsevier Interactive Patient Education  2017 [employer].

## 2017-05-05 NOTE — Progress Notes (Signed)
   Patient is a 56 year old that as a result of her menorrhagia back on April 2 she had a Mirena IUD placed and did not return for 1 month follow-up until now but was also complaining of vaginal discharge with pruritus some dysuria and some frequency but no fever, chills, nausea, vomiting or any back pain. Review of her records indicated that her early this year she had a resectoscopic polypectomy for endometrial polyp and menorrhagia. She has done well especially after the Mirena IUD was placed she did have on deficiency anemia which she is taking iron supplementation now but stated that the Mirena IUD and is having no bleeding. She had been offered a hysterectomy at one time and this was easily option for her and she is happy with it which will be removed in 5 years. She is not suffering from any vasomotor symptoms.  Exam: Gen. appearance well-developed well-nourished female in no acute distress Abdomen: Soft nontender no rebound or guarding Pelvic: Bartholin urethra Skene glands within normal limits Vagina: No lesion white discharge was noted Cervix: IUD string was seen and was trimmed Bimanual exam: Uterus anteverted normal size shape and consistency Adnexa: No palpable mass or tenderness Rectal exam not done  Urinalysis few hyphae yeast noted and moderate bacteria but 0-5 white blood cells and no red blood seen culture pending  Wet prep: Many hyphae yeast moderate bacteria moderate white blood cell  Assessment/plan: Patient doing well with Mirena IUD no further vaginal bleeding. See above note. Patient will have a CBC today and if her iron level has returned back to normal she will discontinue the iron supplementation. It appears she has yeast vaginitis which may been contributing to her irritation underneath the urethra and for this reason we'll call in Diflucan 150 mg tablet 1 by mouth today. Will call in Pyridium 200 mg one by mouth 3 times a day for 3 days we'll wait for the result of the  urine culture and treat for UTI if indeed microorganisms identified I believe her symptoms attributed to her vulvovaginitis. Patient is reminded she needs an annual exam next year.

## 2017-05-06 LAB — URINE CULTURE

## 2017-05-10 ENCOUNTER — Telehealth: Payer: Self-pay | Admitting: *Deleted

## 2017-05-10 NOTE — Telephone Encounter (Signed)
Pt called requesting hemoglobin results on 05/05/17, pt informed, with normal results

## 2017-05-22 NOTE — Addendum Note (Signed)
Addendum  created 05/22/17 1149 by Myrtie Soman, MD   Sign clinical note

## 2017-05-22 NOTE — Anesthesia Postprocedure Evaluation (Signed)
Anesthesia Post Note  Patient: Sandy White  Procedure(s) Performed: Procedure(s) (LRB): DILATATION & CURETTAGE/HYSTEROSCOPY WITH MYOSURE (N/A) EXCISION OF SKIN TAG (Left)     Anesthesia Post Evaluation  Last Vitals:  Vitals:   02/16/17 1450 02/16/17 1600  BP: 110/64 (!) 109/57  Pulse: 61 60  Resp: 18 16  Temp:  36.8 C    Last Pain:  Vitals:   02/17/17 1320  TempSrc:   PainSc: 5                  [name] S

## 2017-07-14 ENCOUNTER — Ambulatory Visit (INDEPENDENT_AMBULATORY_CARE_PROVIDER_SITE_OTHER): Payer: BC Managed Care – PPO | Admitting: Gynecology

## 2017-07-14 ENCOUNTER — Encounter: Payer: Self-pay | Admitting: Gynecology

## 2017-07-14 VITALS — BP 128/80 | Ht 63.0 in | Wt 141.0 lb

## 2017-07-14 DIAGNOSIS — N644 Mastodynia: Secondary | ICD-10-CM

## 2017-07-14 NOTE — Progress Notes (Signed)
   Patient is a 56 year old that presented to the office today complaining of the past week of left breast tenderness occasionally the right but most recently an area that she noted is between the 10:00 to the 1:00 position of the periareolar region. She denies any recent trauma she has not seen any discoloration or has palpated any any mass per se. Patient does have history of melanoma and had been treated at [facility] and as were she had her last mammogram. I was able to poor her mammogram from September of last year and the description was bilateral punctate calcifications that appeared to be benign and recommended follow-up mammogram in 1 year. Her breasts are describe also has been dense. Patient has a family history of breast cancer as follows: Mother, grandmother and 2 sisters.  Breast exam: Both breasts were examined sitting supine position both breasts were symmetrical in appearance no nipple inversion no skin discoloration. Tender glandular area between the 10 and 1:00 position was evident the tissue was more prominent than surrounding tissue and very tender on examination. Contralateral breast was normal. There was no supraclavicular axillary lymphadenopathy  Assessment/plan: Patient will be referred to the breast center for a diagnostic mammogram of this tender hyperglandular area of the left breast and routine screening mammogram contralateral breast. Mammogram in September 2017 at [facility] reported stated that patient had bilateral punctate calcified areas that appeared to be benign. Patient with very strong family history of breast cancer where going to refer her to the geneticist for further evaluation and testing.

## 2017-07-14 NOTE — Patient Instructions (Signed)

## 2017-07-17 ENCOUNTER — Telehealth: Payer: Self-pay | Admitting: *Deleted

## 2017-07-17 DIAGNOSIS — N644 Mastodynia: Secondary | ICD-10-CM

## 2017-07-17 DIAGNOSIS — Z803 Family history of malignant neoplasm of breast: Secondary | ICD-10-CM

## 2017-07-17 NOTE — Telephone Encounter (Signed)
-----   Message from Terrance Mass, MD sent at 07/14/2017 11:55 AM EDT ----- Please schedule diagnostic mammogram of left breast. Tender, glandular area 1-2 o'clock position periareolar. Screening mammogram of right breast. Previous mammogram [facility] Sept 2017 with bilateral punctate calcifications

## 2017-07-17 NOTE — Telephone Encounter (Signed)
Appointment scheduled on 07/20/17 @ 1:30pm at breast center left appointment on pt voicemail per pt request.  Dr.Fernandez also sent another message about appointment for genetic counselor:   "I would like you to schedule an appointment also for the geneticist because of patient's strong family history of breast cancer where as her mother, grandmother and 2 sisters with breast cancer.   Referral placed at [facility] they will contact pt to schedule.

## 2017-07-20 ENCOUNTER — Inpatient Hospital Stay: Admission: RE | Admit: 2017-07-20 | Payer: BC Managed Care – PPO | Source: Ambulatory Visit

## 2017-07-20 ENCOUNTER — Other Ambulatory Visit: Payer: BC Managed Care – PPO

## 2017-07-28 ENCOUNTER — Ambulatory Visit
Admission: RE | Admit: 2017-07-28 | Discharge: 2017-07-28 | Disposition: A | Discharge status: Home / Self Care | Payer: BC Managed Care – PPO | Source: Ambulatory Visit | Attending: Gynecology | Admitting: Gynecology

## 2017-07-28 DIAGNOSIS — N644 Mastodynia: Secondary | ICD-10-CM

## 2017-07-31 ENCOUNTER — Other Ambulatory Visit: Payer: Self-pay | Admitting: Gynecology

## 2017-07-31 ENCOUNTER — Ambulatory Visit
Admission: RE | Admit: 2017-07-31 | Discharge: 2017-07-31 | Disposition: A | Discharge status: Home / Self Care | Payer: BC Managed Care – PPO | Source: Ambulatory Visit | Attending: Gynecology | Admitting: Gynecology

## 2017-07-31 DIAGNOSIS — N6320 Unspecified lump in the left breast, unspecified quadrant: Secondary | ICD-10-CM

## 2017-08-02 ENCOUNTER — Telehealth: Payer: Self-pay | Admitting: *Deleted

## 2017-08-02 DIAGNOSIS — Z803 Family history of malignant neoplasm of breast: Secondary | ICD-10-CM

## 2017-08-02 NOTE — Telephone Encounter (Signed)
-----   Message from Ramond Craver, [place] sent at 08/01/2017  4:34 PM EDT ----- Regarding: referral  Fontaine, Belinda Block, MD  [facility] Provider Pool  Radiology recommends patient to consider genetic counseling due to family history of breast cancer. Offer and arrange for genetic counseling appointment with the [facility] genetic counselors reference family history of breast cancer."  Patient would like to do this. Please schedule. Thanks [employer]

## 2017-08-02 NOTE — Telephone Encounter (Signed)
Referral placed they will contact pt to schedule. 

## 2017-08-02 NOTE — Telephone Encounter (Signed)
[facility] called and left message for pt to call to schedule.

## 2017-08-03 ENCOUNTER — Other Ambulatory Visit: Payer: Self-pay | Admitting: Gynecology

## 2017-08-03 ENCOUNTER — Ambulatory Visit
Admission: RE | Admit: 2017-08-03 | Discharge: 2017-08-03 | Disposition: A | Discharge status: Home / Self Care | Payer: BC Managed Care – PPO | Source: Ambulatory Visit | Attending: Gynecology | Admitting: Gynecology

## 2017-08-03 DIAGNOSIS — N6320 Unspecified lump in the left breast, unspecified quadrant: Secondary | ICD-10-CM

## 2017-08-07 ENCOUNTER — Other Ambulatory Visit: Payer: BC Managed Care – PPO

## 2017-10-06 ENCOUNTER — Ambulatory Visit: Payer: BC Managed Care – PPO | Admitting: Obstetrics & Gynecology

## 2017-10-09 ENCOUNTER — Ambulatory Visit: Payer: BC Managed Care – PPO | Admitting: Gynecology

## 2018-01-12 ENCOUNTER — Other Ambulatory Visit: Payer: Self-pay | Admitting: General Surgery

## 2018-01-12 ENCOUNTER — Ambulatory Visit
Admission: RE | Admit: 2018-01-12 | Discharge: 2018-01-12 | Disposition: A | Discharge status: Home / Self Care | Payer: BC Managed Care – PPO | Source: Ambulatory Visit | Attending: General Surgery | Admitting: General Surgery

## 2018-01-12 DIAGNOSIS — R1011 Right upper quadrant pain: Secondary | ICD-10-CM

## 2018-01-12 MED ORDER — IOPAMIDOL (ISOVUE-300) INJECTION 61%
100.0000 mL | Freq: Once | INTRAVENOUS | Status: AC | PRN
  Administered 2018-01-12: 100 mL via INTRAVENOUS

## 2018-01-15 ENCOUNTER — Other Ambulatory Visit: Payer: Self-pay | Admitting: General Surgery

## 2018-01-15 DIAGNOSIS — R1013 Epigastric pain: Secondary | ICD-10-CM

## 2018-01-17 ENCOUNTER — Other Ambulatory Visit: Payer: Self-pay | Admitting: General Surgery

## 2018-01-17 NOTE — Patient Instructions (Signed)
Sandy White  01/17/2018   Your procedure is scheduled on: 01-19-18    Report to [facility] Main  Entrance     Follow signs to Short Stay on first floor at 530 AM    Call this number if you have problems the morning of surgery [phone]     Remember: NO SOLID FOOD AFTER MIDNIGHT THE NIGHT PRIOR TO [place]. NOTHING BY MOUTH EXCEPT CLEAR LIQUIDS UNTIL 3 HOURS PRIOR TO [place] SURGERY. PLEASE FINISH ENSURE DRINK PER SURGEON ORDER 3 HOURS PRIOR TO SCHEDULED SURGERY TIME WHICH NEEDS TO BE COMPLETED AT ___4:30AM___.      Take these medicines the morning of surgery with A SIP OF WATER: tylenol if needed, levothyroxine                                 You may not have any metal on your body including hair pins and              piercings  Do not wear jewelry, make-up, lotions, powders or perfumes, deodorant             Do not wear nail polish.  Do not shave  48 hours prior to surgery.            Do not bring valuables to the hospital. [name].  Contacts, dentures or bridgework may not be worn into surgery.      Patients discharged the day of surgery will not be allowed to drive home.  Name and phone number of your driver:  Special Instructions: N/A              Please read over the following fact sheets you were given: _____________________________________________________________________           CLEAR LIQUID DIET   Foods Allowed                                                                     Foods Excluded  Coffee and tea, regular and decaf                             liquids that you cannot  Plain Jell-O in any flavor                                             see through such as: Fruit ices (not with fruit pulp)                                     milk, soups, orange juice  Iced Popsicles  All solid food Carbonated beverages, regular and diet                                     Cranberry, grape and apple juices Sports drinks like Gatorade Lightly seasoned clear broth or consume(fat free) Sugar, honey syrup  Sample Menu Breakfast                                Lunch                                     Supper Cranberry juice                    Beef broth                            Chicken broth Jell-O                                     Grape juice                           Apple juice Coffee or tea                        Jell-O                                      Popsicle                                                Coffee or tea                        Coffee or tea  _____________________________________________________________________  [facility] - Preparing for Surgery Before surgery, you can play an important role.  Because skin is not sterile, your skin needs to be as free of germs as possible.  You can reduce the number of germs on your skin by washing with CHG (chlorahexidine gluconate) soap before surgery.  CHG is an antiseptic cleaner which kills germs and bonds with the skin to continue killing germs even after washing. Please DO NOT use if you have an allergy to CHG or antibacterial soaps.  If your skin becomes reddened/irritated stop using the CHG and inform your nurse when you arrive at Short Stay. Do not shave (including legs and underarms) for at least 48 hours prior to the first CHG shower.  You may shave your face/neck. Please follow these instructions carefully:  1.  Shower with CHG Soap the night before surgery and the  morning of Surgery.  2.  If you choose to wash your hair, wash your hair first as usual with your  normal  shampoo.  3.  After you shampoo, rinse your hair and body thoroughly to remove the  shampoo.  4.  Use CHG as you would any other liquid soap.  You can apply chg directly  to the skin and wash                       Gently with a scrungie or clean washcloth.  5.  Apply the CHG  Soap to your body ONLY FROM THE NECK DOWN.   Do not use on face/ open                           Wound or open sores. Avoid contact with eyes, ears mouth and genitals (private parts).                       Wash face,  Genitals (private parts) with your normal soap.             6.  Wash thoroughly, paying special attention to the area where your surgery  will be performed.  7.  Thoroughly rinse your body with warm water from the neck down.  8.  DO NOT shower/wash with your normal soap after using and rinsing off  the CHG Soap.                9.  Pat yourself dry with a clean towel.            10.  Wear clean pajamas.            11.  Place clean sheets on your bed the night of your first shower and do not  sleep with pets. Day of Surgery : Do not apply any lotions/deodorants the morning of surgery.  Please wear clean clothes to the hospital/surgery center.  FAILURE TO FOLLOW THESE INSTRUCTIONS MAY RESULT IN THE CANCELLATION OF YOUR SURGERY PATIENT SIGNATURE_________________________________  NURSE SIGNATURE__________________________________  ________________________________________________________________________

## 2018-01-18 ENCOUNTER — Encounter (HOSPITAL_COMMUNITY): Payer: Self-pay

## 2018-01-18 ENCOUNTER — Encounter (HOSPITAL_COMMUNITY)
Admission: RE | Admit: 2018-01-18 | Discharge: 2018-01-18 | Disposition: A | Discharge status: Home / Self Care | Payer: BC Managed Care – PPO | Source: Ambulatory Visit | Attending: General Surgery | Admitting: General Surgery

## 2018-01-18 ENCOUNTER — Other Ambulatory Visit: Payer: Self-pay

## 2018-01-18 DIAGNOSIS — Z85820 Personal history of malignant melanoma of skin: Secondary | ICD-10-CM | POA: Diagnosis not present

## 2018-01-18 DIAGNOSIS — E039 Hypothyroidism, unspecified: Secondary | ICD-10-CM | POA: Diagnosis not present

## 2018-01-18 DIAGNOSIS — K219 Gastro-esophageal reflux disease without esophagitis: Secondary | ICD-10-CM | POA: Diagnosis not present

## 2018-01-18 DIAGNOSIS — K811 Chronic cholecystitis: Secondary | ICD-10-CM | POA: Diagnosis present

## 2018-01-18 DIAGNOSIS — Z79899 Other long term (current) drug therapy: Secondary | ICD-10-CM | POA: Diagnosis not present

## 2018-01-18 HISTORY — DX: Unspecified hearing loss, unspecified ear: H91.90

## 2018-01-18 HISTORY — DX: Other specified postprocedural states: Z98.890

## 2018-01-18 HISTORY — DX: Nausea with vomiting, unspecified: R11.2

## 2018-01-18 HISTORY — DX: Other specified postprocedural states: R11.2

## 2018-01-18 LAB — CBC WITH DIFFERENTIAL/PLATELET
Basophils Absolute: 0 10*3/uL (ref 0.0–0.1)
Basophils Relative: 0 %
EOS PCT: 2 %
Eosinophils Absolute: 0.2 10*3/uL (ref 0.0–0.7)
HCT: 42.5 % (ref 36.0–46.0)
HEMOGLOBIN: 14.5 g/dL (ref 12.0–15.0)
LYMPHS ABS: 2 10*3/uL (ref 0.7–4.0)
LYMPHS PCT: 28 %
MCH: 30.1 pg (ref 26.0–34.0)
MCHC: 34.1 g/dL (ref 30.0–36.0)
MCV: 88.4 fL (ref 78.0–100.0)
MONOS PCT: 5 %
Monocytes Absolute: 0.4 10*3/uL (ref 0.1–1.0)
NEUTROS PCT: 65 %
Neutro Abs: 4.7 10*3/uL (ref 1.7–7.7)
Platelets: 263 10*3/uL (ref 150–400)
RBC: 4.81 MIL/uL (ref 3.87–5.11)
RDW: 14.4 % (ref 11.5–15.5)
WBC: 7.3 10*3/uL (ref 4.0–10.5)

## 2018-01-18 LAB — COMPREHENSIVE METABOLIC PANEL
ALK PHOS: 115 U/L (ref 38–126)
ALT: 39 U/L (ref 14–54)
ANION GAP: 8 (ref 5–15)
AST: 36 U/L (ref 15–41)
Albumin: 4.1 g/dL (ref 3.5–5.0)
BILIRUBIN TOTAL: 0.2 mg/dL — AB (ref 0.3–1.2)
BUN: 18 mg/dL (ref 6–20)
CALCIUM: 8.9 mg/dL (ref 8.9–10.3)
CO2: 25 mmol/L (ref 22–32)
CREATININE: 0.72 mg/dL (ref 0.44–1.00)
Chloride: 107 mmol/L (ref 101–111)
Glucose, Bld: 100 mg/dL — ABNORMAL HIGH (ref 65–99)
Potassium: 3.7 mmol/L (ref 3.5–5.1)
SODIUM: 140 mmol/L (ref 135–145)
TOTAL PROTEIN: 7 g/dL (ref 6.5–8.1)

## 2018-01-18 LAB — HCG, SERUM, QUALITATIVE: PREG SERUM: NEGATIVE

## 2018-01-19 ENCOUNTER — Encounter (HOSPITAL_COMMUNITY): Payer: Self-pay | Admitting: *Deleted

## 2018-01-19 ENCOUNTER — Other Ambulatory Visit: Payer: Self-pay

## 2018-01-19 ENCOUNTER — Ambulatory Visit (HOSPITAL_COMMUNITY)
Admission: RE | Admit: 2018-01-19 | Discharge: 2018-01-19 | Disposition: A | Discharge status: Home / Self Care | Payer: BC Managed Care – PPO | Source: Ambulatory Visit | Attending: General Surgery | Admitting: General Surgery

## 2018-01-19 ENCOUNTER — Ambulatory Visit (HOSPITAL_COMMUNITY): Payer: BC Managed Care – PPO | Admitting: Certified Registered Nurse Anesthetist

## 2018-01-19 ENCOUNTER — Encounter (HOSPITAL_COMMUNITY)
Admission: RE | Disposition: A | Discharge status: Home / Self Care | Payer: Self-pay | Source: Ambulatory Visit | Attending: General Surgery

## 2018-01-19 DIAGNOSIS — E039 Hypothyroidism, unspecified: Secondary | ICD-10-CM | POA: Insufficient documentation

## 2018-01-19 DIAGNOSIS — Z85820 Personal history of malignant melanoma of skin: Secondary | ICD-10-CM | POA: Insufficient documentation

## 2018-01-19 DIAGNOSIS — K219 Gastro-esophageal reflux disease without esophagitis: Secondary | ICD-10-CM | POA: Insufficient documentation

## 2018-01-19 DIAGNOSIS — Z79899 Other long term (current) drug therapy: Secondary | ICD-10-CM | POA: Insufficient documentation

## 2018-01-19 DIAGNOSIS — K811 Chronic cholecystitis: Secondary | ICD-10-CM | POA: Diagnosis not present

## 2018-01-19 HISTORY — PX: CHOLECYSTECTOMY: SHX55

## 2018-01-19 SURGERY — LAPAROSCOPIC CHOLECYSTECTOMY
Anesthesia: General

## 2018-01-19 MED ORDER — ACETAMINOPHEN 650 MG RE SUPP: 650.0000 mg | RECTAL | Status: DC | PRN

## 2018-01-19 MED ORDER — BUPIVACAINE-EPINEPHRINE (PF) 0.5% -1:200000 IJ SOLN
INTRAMUSCULAR | Status: DC | PRN
  Administered 2018-01-19: 17 mL

## 2018-01-19 MED ORDER — ENSURE PRE-SURGERY PO LIQD
592.0000 mL | Freq: Once | ORAL | Status: DC
  Filled 2018-01-19: qty 592

## 2018-01-19 MED ORDER — ONDANSETRON HCL 4 MG/2ML IJ SOLN
INTRAMUSCULAR | Status: AC
  Filled 2018-01-19: qty 2

## 2018-01-19 MED ORDER — FENTANYL CITRATE (PF) 100 MCG/2ML IJ SOLN
INTRAMUSCULAR | Status: DC | PRN
  Administered 2018-01-19 (×2): 100 ug via INTRAVENOUS

## 2018-01-19 MED ORDER — SODIUM CHLORIDE 0.9 % IV SOLN: 250.0000 mL | INTRAVENOUS | Status: DC | PRN

## 2018-01-19 MED ORDER — HYDROMORPHONE HCL 1 MG/ML IJ SOLN
INTRAMUSCULAR | Status: AC
  Filled 2018-01-19: qty 1

## 2018-01-19 MED ORDER — LACTATED RINGERS IR SOLN
Status: DC | PRN
  Administered 2018-01-19: 1000 mL

## 2018-01-19 MED ORDER — DEXAMETHASONE SODIUM PHOSPHATE 10 MG/ML IJ SOLN
INTRAMUSCULAR | Status: AC
  Filled 2018-01-19: qty 1

## 2018-01-19 MED ORDER — KETAMINE HCL 10 MG/ML IJ SOLN
INTRAMUSCULAR | Status: AC
  Filled 2018-01-19: qty 1

## 2018-01-19 MED ORDER — ACETAMINOPHEN 500 MG PO TABS
1000.0000 mg | ORAL_TABLET | ORAL | Status: AC
  Administered 2018-01-19: 1000 mg via ORAL
  Filled 2018-01-19: qty 2

## 2018-01-19 MED ORDER — SCOPOLAMINE 1 MG/3DAYS TD PT72
1.0000 | MEDICATED_PATCH | TRANSDERMAL | Status: DC
  Administered 2018-01-19: 1.5 mg via TRANSDERMAL

## 2018-01-19 MED ORDER — MIDAZOLAM HCL 5 MG/5ML IJ SOLN
INTRAMUSCULAR | Status: DC | PRN
  Administered 2018-01-19: 2 mg via INTRAVENOUS

## 2018-01-19 MED ORDER — HYDROMORPHONE HCL 1 MG/ML IJ SOLN: 0.2500 mg | INTRAMUSCULAR | Status: DC | PRN

## 2018-01-19 MED ORDER — CELECOXIB 200 MG PO CAPS
400.0000 mg | ORAL_CAPSULE | ORAL | Status: AC
  Administered 2018-01-19: 400 mg via ORAL
  Filled 2018-01-19: qty 2

## 2018-01-19 MED ORDER — SUGAMMADEX SODIUM 500 MG/5ML IV SOLN
INTRAVENOUS | Status: DC | PRN
  Administered 2018-01-19: 300 mg via INTRAVENOUS

## 2018-01-19 MED ORDER — EPHEDRINE SULFATE-NACL 50-0.9 MG/10ML-% IV SOSY
PREFILLED_SYRINGE | INTRAVENOUS | Status: DC | PRN
  Administered 2018-01-19 (×2): 10 mg via INTRAVENOUS

## 2018-01-19 MED ORDER — ACETAMINOPHEN 325 MG PO TABS
650.0000 mg | ORAL_TABLET | ORAL | Status: DC | PRN
Start: 2018-01-19 — End: 2018-01-19

## 2018-01-19 MED ORDER — PROPOFOL 10 MG/ML IV BOLUS
INTRAVENOUS | Status: DC | PRN
  Administered 2018-01-19: 160 mg via INTRAVENOUS

## 2018-01-19 MED ORDER — BUPIVACAINE HCL (PF) 0.25 % IJ SOLN
INTRAMUSCULAR | Status: AC
  Filled 2018-01-19: qty 30

## 2018-01-19 MED ORDER — FENTANYL CITRATE (PF) 250 MCG/5ML IJ SOLN
INTRAMUSCULAR | Status: AC
  Filled 2018-01-19: qty 5

## 2018-01-19 MED ORDER — OXYCODONE HCL 5 MG PO TABS: 5.0000 mg | ORAL_TABLET | ORAL | Status: DC | PRN

## 2018-01-19 MED ORDER — LIDOCAINE 2% (20 MG/ML) 5 ML SYRINGE
INTRAMUSCULAR | Status: DC | PRN
  Administered 2018-01-19: 60 mg via INTRAVENOUS

## 2018-01-19 MED ORDER — LIDOCAINE 2% (20 MG/ML) 5 ML SYRINGE
INTRAMUSCULAR | Status: DC | PRN
  Administered 2018-01-19: 1.5 mg/kg/h via INTRAVENOUS

## 2018-01-19 MED ORDER — SODIUM CHLORIDE 0.9% FLUSH: 3.0000 mL | INTRAVENOUS | Status: DC | PRN

## 2018-01-19 MED ORDER — PROMETHAZINE HCL 25 MG/ML IJ SOLN
6.2500 mg | INTRAMUSCULAR | Status: AC | PRN
Start: 2018-01-19 — End: 2018-01-19
  Administered 2018-01-19: 6.25 mg via INTRAVENOUS
  Administered 2018-01-19: 12.5 mg via INTRAVENOUS

## 2018-01-19 MED ORDER — BUPIVACAINE-EPINEPHRINE (PF) 0.5% -1:200000 IJ SOLN
INTRAMUSCULAR | Status: AC
  Filled 2018-01-19: qty 30

## 2018-01-19 MED ORDER — SCOPOLAMINE 1 MG/3DAYS TD PT72
MEDICATED_PATCH | TRANSDERMAL | Status: AC
  Filled 2018-01-19: qty 1

## 2018-01-19 MED ORDER — DEXAMETHASONE SODIUM PHOSPHATE 10 MG/ML IJ SOLN
INTRAMUSCULAR | Status: DC | PRN
  Administered 2018-01-19: 10 mg via INTRAVENOUS

## 2018-01-19 MED ORDER — 0.9 % SODIUM CHLORIDE (POUR BTL) OPTIME
TOPICAL | Status: DC | PRN
  Administered 2018-01-19: 1000 mL

## 2018-01-19 MED ORDER — MIDAZOLAM HCL 2 MG/2ML IJ SOLN
INTRAMUSCULAR | Status: AC
  Filled 2018-01-19: qty 2

## 2018-01-19 MED ORDER — SODIUM CHLORIDE 0.9% FLUSH: 3.0000 mL | Freq: Two times a day (BID) | INTRAVENOUS | Status: DC

## 2018-01-19 MED ORDER — ONDANSETRON HCL 4 MG/2ML IJ SOLN
4.0000 mg | Freq: Once | INTRAMUSCULAR | Status: AC
  Administered 2018-01-19: 4 mg via INTRAVENOUS

## 2018-01-19 MED ORDER — OXYCODONE HCL 5 MG PO TABS: 5.0000 mg | ORAL_TABLET | Freq: Four times a day (QID) | ORAL | 0 refills | Status: DC | PRN

## 2018-01-19 MED ORDER — CEFAZOLIN SODIUM-DEXTROSE 2-4 GM/100ML-% IV SOLN
2.0000 g | INTRAVENOUS | Status: AC
  Administered 2018-01-19: 2 g via INTRAVENOUS
  Filled 2018-01-19: qty 100

## 2018-01-19 MED ORDER — KETAMINE HCL 10 MG/ML IJ SOLN
INTRAMUSCULAR | Status: DC | PRN
  Administered 2018-01-19: 20 mg via INTRAVENOUS

## 2018-01-19 MED ORDER — GABAPENTIN 300 MG PO CAPS
300.0000 mg | ORAL_CAPSULE | ORAL | Status: AC
  Administered 2018-01-19: 300 mg via ORAL
  Filled 2018-01-19: qty 1

## 2018-01-19 MED ORDER — LACTATED RINGERS IV SOLN
INTRAVENOUS | Status: DC | PRN
  Administered 2018-01-19 (×2): via INTRAVENOUS

## 2018-01-19 MED ORDER — KETOROLAC TROMETHAMINE 30 MG/ML IJ SOLN: 30.0000 mg | Freq: Once | INTRAMUSCULAR | Status: DC | PRN

## 2018-01-19 MED ORDER — LIDOCAINE 2% (20 MG/ML) 5 ML SYRINGE
INTRAMUSCULAR | Status: AC
  Filled 2018-01-19: qty 10

## 2018-01-19 MED ORDER — PROPOFOL 10 MG/ML IV BOLUS
INTRAVENOUS | Status: AC
  Filled 2018-01-19: qty 20

## 2018-01-19 MED ORDER — PROMETHAZINE HCL 25 MG/ML IJ SOLN
INTRAMUSCULAR | Status: AC
  Administered 2018-01-19: 12.5 mg via INTRAVENOUS
  Filled 2018-01-19: qty 1

## 2018-01-19 MED ORDER — PROMETHAZINE HCL 12.5 MG PO TABS
12.5000 mg | ORAL_TABLET | Freq: Four times a day (QID) | ORAL | 0 refills | Status: DC | PRN
Start: 2018-01-19 — End: 2019-02-01

## 2018-01-19 MED ORDER — IOPAMIDOL (ISOVUE-300) INJECTION 61%
INTRAVENOUS | Status: AC
  Filled 2018-01-19: qty 50

## 2018-01-19 MED ORDER — ROCURONIUM BROMIDE 10 MG/ML (PF) SYRINGE
PREFILLED_SYRINGE | INTRAVENOUS | Status: DC | PRN
  Administered 2018-01-19: 50 mg via INTRAVENOUS

## 2018-01-19 MED ORDER — SUGAMMADEX SODIUM 500 MG/5ML IV SOLN
INTRAVENOUS | Status: AC
  Filled 2018-01-19: qty 5

## 2018-01-19 MED ORDER — ONDANSETRON HCL 4 MG/2ML IJ SOLN
INTRAMUSCULAR | Status: DC | PRN
  Administered 2018-01-19: 4 mg via INTRAVENOUS

## 2018-01-19 SURGICAL SUPPLY — 40 items
APPLIER CLIP 5 13 M/L LIGAMAX5 (MISCELLANEOUS) ×2
APPLIER CLIP ROT 10 11.4 M/L (STAPLE)
BENZOIN TINCTURE PRP APPL 2/3 (GAUZE/BANDAGES/DRESSINGS) IMPLANT
CABLE HIGH FREQUENCY MONO STRZ (ELECTRODE) ×2 IMPLANT
CHLORAPREP W/TINT 26ML (MISCELLANEOUS) ×2 IMPLANT
CLIP APPLIE 5 13 M/L LIGAMAX5 (MISCELLANEOUS) ×1 IMPLANT
CLIP APPLIE ROT 10 11.4 M/L (STAPLE) IMPLANT
COVER MAYO STAND STRL (DRAPES) ×2 IMPLANT
COVER SURGICAL LIGHT HANDLE (MISCELLANEOUS) ×2 IMPLANT
DECANTER SPIKE VIAL GLASS SM (MISCELLANEOUS) ×2 IMPLANT
DERMABOND ADVANCED (GAUZE/BANDAGES/DRESSINGS) ×1
DERMABOND ADVANCED .7 DNX12 (GAUZE/BANDAGES/DRESSINGS) ×1 IMPLANT
DEVICE TROCAR PUNCTURE CLOSURE (ENDOMECHANICALS) ×2 IMPLANT
DRAPE C-ARM 42X120 X-RAY (DRAPES) IMPLANT
ELECT REM PT RETURN 15FT ADLT (MISCELLANEOUS) ×2 IMPLANT
GLOVE BIO SURGEON STRL SZ7 (GLOVE) ×2 IMPLANT
GLOVE BIOGEL PI IND STRL 7.5 (GLOVE) ×1 IMPLANT
GLOVE BIOGEL PI INDICATOR 7.5 (GLOVE) ×1
GOWN STRL REUS W/TWL LRG LVL3 (GOWN DISPOSABLE) ×2 IMPLANT
GOWN STRL REUS W/TWL XL LVL3 (GOWN DISPOSABLE) ×4 IMPLANT
HEMOSTAT SNOW SURGICEL 2X4 (HEMOSTASIS) ×2 IMPLANT
KIT BASIN OR (CUSTOM PROCEDURE TRAY) ×2 IMPLANT
POSITIONER SURGICAL ARM (MISCELLANEOUS) IMPLANT
POUCH RETRIEVAL ECOSAC 10 (ENDOMECHANICALS) ×1 IMPLANT
POUCH RETRIEVAL ECOSAC 10MM (ENDOMECHANICALS) ×1
SCISSORS METZENBAUM CVD 33 (INSTRUMENTS) ×2 IMPLANT
SET CHOLANGIOGRAPH MIX (MISCELLANEOUS) IMPLANT
SET IRRIG TUBING LAPAROSCOPIC (IRRIGATION / IRRIGATOR) ×2 IMPLANT
SLEEVE XCEL OPT CAN 5 100 (ENDOMECHANICALS) ×4 IMPLANT
STRIP CLOSURE SKIN 1/2X4 (GAUZE/BANDAGES/DRESSINGS) ×2 IMPLANT
SUT MNCRL AB 4-0 PS2 18 (SUTURE) ×2 IMPLANT
SUT VICRYL 0 TIES 12 18 (SUTURE) IMPLANT
SUT VICRYL 0 UR6 27IN ABS (SUTURE) ×2 IMPLANT
TOWEL OR 17X26 10 PK STRL BLUE (TOWEL DISPOSABLE) ×2 IMPLANT
TOWEL OR NON WOVEN STRL DISP B (DISPOSABLE) ×2 IMPLANT
TRAY LAPAROSCOPIC (CUSTOM PROCEDURE TRAY) ×2 IMPLANT
TROCAR BLADELESS OPT 5 100 (ENDOMECHANICALS) ×2 IMPLANT
TROCAR XCEL BLUNT TIP 100MML (ENDOMECHANICALS) ×2 IMPLANT
TROCAR XCEL NON-BLD 11X100MML (ENDOMECHANICALS) IMPLANT
TUBING INSUF HEATED (TUBING) ×2 IMPLANT

## 2018-01-19 NOTE — Transfer of Care (Signed)
Immediate Anesthesia Transfer of Care Note  Patient: Sandy White  Procedure(s) Performed: LAPAROSCOPIC CHOLECYSTECTOMY (N/A )  Patient Location: PACU  Anesthesia Type:General  Level of Consciousness: sedated, patient cooperative and responds to stimulation  Airway & Oxygen Therapy: Patient Spontanous Breathing and Patient connected to face mask oxygen  Post-op Assessment: Report given to RN and Post -op Vital signs reviewed and stable  Post vital signs: Reviewed and stable  Last Vitals:  Vitals:   01/19/18 0536  BP: 137/84  Pulse: 91  Resp: 18  Temp: 36.7 C  SpO2: 100%    Last Pain:  Vitals:   01/19/18 0555  TempSrc:   PainSc: 1       Patients Stated Pain Goal: 4 (26/71/24 5809)  Complications: No apparent anesthesia complications

## 2018-01-19 NOTE — Interval H&P Note (Signed)
History and Physical Interval Note:  01/19/2018 7:14 AM  Sandy White  has presented today for surgery, with the diagnosis of RUQ pain  The various methods of treatment have been discussed with the patient and family. After consideration of risks, benefits and other options for treatment, the patient has consented to  Procedure(s): LAPAROSCOPIC CHOLECYSTECTOMY (N/A) as a surgical intervention .  The patient's history has been reviewed, patient examined, no change in status, stable for surgery.  I have reviewed the patient's chart and labs.  Questions were answered to the patient's satisfaction.     Rolm Bookbinder

## 2018-01-19 NOTE — Anesthesia Preprocedure Evaluation (Signed)
Anesthesia Evaluation  Patient identified by MRN, date of birth, ID band Patient awake    Reviewed: Allergy & Precautions, NPO status , Patient's Chart, lab work & pertinent test results  Airway Mallampati: II  TM Distance: >3 FB Neck ROM: Full    Dental no notable dental hx.    Pulmonary neg pulmonary ROS,    Pulmonary exam normal breath sounds clear to auscultation       Cardiovascular negative cardio ROS Normal cardiovascular exam Rhythm:Regular Rate:Normal     Neuro/Psych negative neurological ROS  negative psych ROS   GI/Hepatic negative GI ROS, Neg liver ROS,   Endo/Other  Hypothyroidism   Renal/GU negative Renal ROS  negative genitourinary   Musculoskeletal negative musculoskeletal ROS (+)   Abdominal   Peds negative pediatric ROS (+)  Hematology negative hematology ROS (+)   Anesthesia Other Findings   Reproductive/Obstetrics negative OB ROS                             Anesthesia Physical Anesthesia Plan  ASA: II  Anesthesia Plan: General   Post-op Pain Management:    Induction: Intravenous  PONV Risk Score and Plan: 3 and Ondansetron, Dexamethasone, Midazolam and Treatment may vary due to age or medical condition  Airway Management Planned: Oral ETT  Additional Equipment:   Intra-op Plan:   Post-operative Plan: Extubation in OR  Informed Consent: I have reviewed the patients History and Physical, chart, labs and discussed the procedure including the risks, benefits and alternatives for the proposed anesthesia with the patient or authorized representative who has indicated his/her understanding and acceptance.   Dental advisory given  Plan Discussed with: CRNA and Surgeon  Anesthesia Plan Comments:         Anesthesia Quick Evaluation

## 2018-01-19 NOTE — H&P (Signed)
57 yof referred by Dr Willey Blade for ruq pain. she has years long history of intermittent ruq pain. she has nl [place] in 2013 and nl [place] in 2017. has undergone egd in past with dilation of esophageal stricture ? it sounds like. over past few months she has increasing constant ruq pain that is made worse with meals. this radiates to her back. it is associated with nausea. she is limited by this pain now. she is here with her husband to discuss. 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  Past Surgical History Sandy White, [place]; 01/12/2018 9:50 AM) Breast Biopsy  Bilateral. Cesarean Section - 1   Diagnostic Studies History Sandy White, [place]; 01/12/2018 9:50 AM) Colonoscopy  never Mammogram  within last year Pap Smear  1-5 years ago  Allergies Sandy White, RMA; 01/12/2018 9:53 AM) No Known Drug Allergies [01/12/2018]: Allergies Reconciled   Medication History Sandy White, [place]; 01/12/2018 9:56 AM) Escitalopram Oxalate (10MG  Tablet, Oral) Active. Lisinopril (20MG  Tablet, Oral) Active. Tylenol PM Extra Strength (500-25MG  Tablet, Oral) Active. Synthroid (50MCG Tablet, Oral) Active. Zoloft (25MG  Tablet, Oral) Active. Vitamin E (100UNIT Capsule, Oral) Active. Flax Seed Oil (1000MG  Capsule, Oral) Active. Medications Reconciled  Social History Sandy White, [place]; 01/12/2018 9:50 AM) Caffeine use  Coffee. No alcohol use  No drug use  Tobacco use  Never smoker.  Family History Sandy White, [place]; 01/12/2018 9:50 AM) Breast Cancer  Mother, Sister. Migraine Headache  Brother, Daughter, Mother, Sister, Son.  Pregnancy / Birth History Sandy White, [place]; 01/12/2018 9:50 AM) Age at menarche  57 years. Age of menopause  [age] Contraceptive History  Intrauterine device. Gravida  4 Irregular periods  Length (months) of breastfeeding  >24 Maternal age  96-25 Para  81  Other Problems Sandy White, [place]; 01/12/2018 9:50  AM) Gastroesophageal Reflux Disease  Melanoma    Review of Systems Sandy White RMA; 01/12/2018 9:50 AM) General Present- Appetite Loss, Fatigue and Night Sweats. Not Present- Chills, Fever, Weight Gain and Weight Loss. Skin Not Present- Change in Wart/Mole, Dryness, Hives, Jaundice, New Lesions, Non-Healing Wounds, Rash and Ulcer. HEENT Present- Hearing Loss, Ringing in the Ears and Wears glasses/contact lenses. Not Present- Earache, Hoarseness, Nose Bleed, Oral Ulcers, Seasonal Allergies, Sinus Pain, Sore Throat, Visual Disturbances and Yellow Eyes. Respiratory Not Present- Bloody sputum, Chronic Cough, Difficulty Breathing, Snoring and Wheezing. Breast Not Present- Breast Mass, Breast Pain, Nipple Discharge and Skin Changes. Cardiovascular Not Present- Chest Pain, Difficulty Breathing Lying Down, Leg Cramps, Palpitations, Rapid Heart Rate, Shortness of Breath and Swelling of Extremities. Gastrointestinal Present- Abdominal Pain, Bloating, Change in Bowel Habits, Gets full quickly at meals, Indigestion and Nausea. Not Present- Bloody Stool, Chronic diarrhea, Constipation, Difficulty Swallowing, Excessive gas, Hemorrhoids, Rectal Pain and Vomiting. Female Genitourinary Not Present- Frequency, Nocturia, Painful Urination, Pelvic Pain and Urgency. Musculoskeletal Not Present- Back Pain, Joint Pain, Joint Stiffness, Muscle Pain, Muscle Weakness and Swelling of Extremities. Neurological Not Present- Decreased Memory, Fainting, Headaches, Numbness, Seizures, Tingling, Tremor, Trouble walking and Weakness. Psychiatric Not Present- Anxiety, Bipolar, Change in Sleep Pattern, Depression, Fearful and Frequent crying. Endocrine Present- Hot flashes. Not Present- Cold Intolerance, Excessive Hunger, Hair Changes, Heat Intolerance and New Diabetes. Hematology Not Present- Blood Thinners, Easy Bruising, Excessive bleeding, Gland problems, HIV and Persistent Infections.  Vitals Sandy White RMA; 01/12/2018  9:52 AM) 01/12/2018 9:51 AM Weight: 143.8 lb Height: 62in Body Surface Area: 1.66 m Body Mass Index: 26.3 kg/m  Temp.: 98.12F  Pulse: 98 (Regular)  BP: 125/82 (  Sitting, Left Arm, Standard) Physical Exam Rolm Bookbinder MD; 01/12/2018 10:30 AM) General Mental Status-Alert. Head and Neck Trachea-midline. Eye Sclera/Conjunctiva - Bilateral-No scleral icterus. Chest and Lung Exam Chest and lung exam reveals -quiet, even and easy respiratory effort with no use of accessory muscles and on auscultation, normal breath sounds, no adventitious sounds and normal vocal resonance. Cardiovascular Cardiovascular examination reveals -normal heart sounds, regular rate and rhythm with no murmurs. Abdomen Note: soft tender ruq to palpation Neurologic Neurologic evaluation reveals -alert and oriented x 3 with no impairment of recent or remote memory.   Assessment & Plan Rolm Bookbinder MD; 01/12/2018 10:33 AM) RIGHT UPPER QUADRANT PAIN (R10.11) Story: I think this sounds like gallbladder as source. will send for ct scan prior to surgery. If this is otherwise negative I think reasonable to consider lap chole. she understands there is small chance with negative imaging this will not solve her current problem. I discussed the procedure in detail. We discussed the risks and benefits of a laparoscopic cholecystectomy and possible cholangiogram including, but not limited to bleeding, infection, injury to surrounding structures such as the intestine or liver, bile leak, retained gallstones, need to convert to an open procedure, prolonged diarrhea, blood clots such as DVT, common bile duct injury, anesthesia risks, and possible need for additional procedures. The likelihood of improvement in symptoms and return to the patient's normal status is good. We discussed the typical post-operative recovery course.   Ct normal, plan lap chole

## 2018-01-19 NOTE — Op Note (Signed)
Preoperative diagnosis:biliary dyskinesia Postoperative diagnosis:chronic cholecystitis Procedure: Laparoscopic cholecystectomy Surgeon: Dr. Serita Grammes Anesthesia: Gen. Specimens: gb to pathology Estimated blood loss: minimal Complications: None Drains: none Sponge count was correct at completion Disposition to recovery stable  Indications: This is a89 yof with ruq pain. She has negative testing and negative ct scan.  I still think she has biliary symptoms.  I discussed a laparoscopic cholecystectomy with her.   Procedure: After informed consent was obtained the patient was taken to the operating room. She was givenantibiotics. SCDs were in place. She was placed undergeneral anesthesia without complication. Her abdomen was prepped and draped in the standard sterile surgical fashion. A surgical timeout was then performed.  I infiltrated marcainebelow the umbilicus.Imade a vertical incision. I incised the fascia and entered the peritoneum bluntly. I placed a pursestring suture. I inserted a hasson trocar and insufflated to 15 mm Hg pressure.  I then placed a 5 mm trocar in theepigastrium.Two5 mm trocars were placed in the right side of the abdomen.The gallbladder had evidence of chronic cholecystitis.Igrasped the gallbladder and retracted cephalad and lateral.EventuallyI was able to identify the critical view of safety.I then clipped the duct and divided it. The duct was viable. The clips traversed the duct. I then clipped and divided the cystic artery. I then removed the gallbladder from the liver bed. I placed the gallbladderin a bag and removed from the umbilicus. I obtained hemostasis.I then removed the hasson trocar. I tied the pursestring down and placed an additional 2-0 vicryls with the endoclose device. I then removed the remaining trocars and these were closed with 4-0 Monocryl and glue. She tolerated this well be transferred to the recovery  room.

## 2018-01-19 NOTE — Anesthesia Postprocedure Evaluation (Signed)
Anesthesia Post Note  Patient: Sandy White  Procedure(s) Performed: LAPAROSCOPIC CHOLECYSTECTOMY (N/A )     Patient location during evaluation: PACU Anesthesia Type: General Level of consciousness: awake and alert Pain management: pain level controlled Vital Signs Assessment: post-procedure vital signs reviewed and stable Respiratory status: spontaneous breathing, nonlabored ventilation, respiratory function stable and patient connected to nasal cannula oxygen Cardiovascular status: blood pressure returned to baseline and stable Postop Assessment: no apparent nausea or vomiting Anesthetic complications: no    Last Vitals:  Vitals:   01/19/18 0848 01/19/18 0900  BP: (!) 143/77 140/75  Pulse: 93 81  Resp: 18 19  Temp: (!) 36.3 C   SpO2: 100% 100%    Last Pain:  Vitals:   01/19/18 0555  TempSrc:   PainSc: 1                  Precilla Purnell S

## 2018-01-19 NOTE — Anesthesia Procedure Notes (Signed)
Procedure Name: Intubation °Performed by: Madalyne Husk J, CRNA °Pre-anesthesia Checklist: Patient identified, Emergency Drugs available, Suction available, Patient being monitored and Timeout performed °Patient Re-evaluated:Patient Re-evaluated prior to induction °Oxygen Delivery Method: Circle system utilized °Preoxygenation: Pre-oxygenation with 100% oxygen °Induction Type: IV induction °Ventilation: Mask ventilation without difficulty °Laryngoscope Size: Mac and 3 °Grade View: Grade I °Tube type: Oral °Tube size: 7.0 mm °Number of attempts: 1 °Airway Equipment and Method: Stylet °Placement Confirmation: ETT inserted through vocal cords under direct vision,  positive ETCO2,  CO2 detector and breath sounds checked- equal and bilateral °Secured at: 21 cm °Tube secured with: Tape °Dental Injury: Teeth and Oropharynx as per pre-operative assessment  ° ° ° ° ° ° °

## 2018-01-19 NOTE — Discharge Instructions (Signed)
[facility] LAPAROSCOPIC SURGERY: POST OP INSTRUCTIONS  Always review your discharge instruction sheet given to you by the facility where your surgery was performed. IF YOU HAVE DISABILITY OR FAMILY LEAVE FORMS, YOU MUST BRING THEM TO THE OFFICE FOR PROCESSING.   DO NOT GIVE THEM TO YOUR DOCTOR.  1. A prescription for pain medication may be given to you upon discharge.  Take your pain medication as prescribed, if needed.  If narcotic pain medicine is not needed, then you may take acetaminophen (Tylenol), naprosyn (Alleve), or ibuprofen (Advil) as needed. 2. Take your usually prescribed medications unless otherwise directed. 3. If you need a refill on your pain medication, please contact your pharmacy.  They will contact our office to request authorization. Prescriptions will not be filled after 5pm or on week-ends. 4. You should follow a light diet the first few days after arrival home, such as soup and crackers, etc.  Be sure to include lots of fluids daily. 5. Most patients will experience some swelling and bruising in the area of the incisions.  Ice packs will help.  Swelling and bruising can take several days to resolve.  6. It is common to experience some constipation if taking pain medication after surgery.  Increasing fluid intake and taking a stool softener (such as Colace) will usually help or prevent this problem from occurring.  A mild laxative (Milk of Magnesia or Miralax) should be taken according to package instructions if there are no bowel movements after 48 hours. 7. Unless discharge instructions indicate otherwise, you may remove your bandages 48 hours after surgery, and you may shower at that time.  You may have steri-strips (small skin tapes) in place directly over the incision.  These strips should be left on the skin for 7-10 days.  If your surgeon used skin glue on the incision, you may shower in 24 hours.  The glue will flake  off over the next 2-3 weeks.  Any sutures or staples will be removed at the office during your follow-up visit. 8. ACTIVITIES:  You may resume regular (light) daily activities beginning the next day--such as daily self-care, walking, climbing stairs--gradually increasing activities as tolerated.  You may have sexual intercourse when it is comfortable.  Refrain from any heavy lifting or straining until approved by your doctor. a. You may drive when you are no longer taking prescription pain medication, you can comfortably wear a seatbelt, and you can safely maneuver your car and apply brakes. b. RETURN TO WORK:  __________________________________________________________ 9. You should see your doctor in the office for a follow-up appointment approximately 2-3 weeks after your surgery.  Make sure that you call for this appointment within a day or two after you arrive home to insure a convenient appointment time. 10. OTHER INSTRUCTIONS: __________________________________________________________________________________________________________________________ __________________________________________________________________________________________________________________________ WHEN TO CALL YOUR DOCTOR: 1. Fever over 101.0 2. Inability to urinate 3. Continued bleeding from incision. 4. Increased pain, redness, or drainage from the incision. 5. Increasing abdominal pain  The clinic staff is available to answer your questions during regular business hours.  Please don't hesitate to call and ask to speak to one of the nurses for clinical concerns.  If you have a medical emergency, go to the nearest emergency room or call 911.  A surgeon from [facility] is always on call at the hospital. [address], [address], [place], [place]  [address] ? [address] [address], [place], [place]   [address] [phone] ? [phone] ? [phone] Web site: [email or web address]  

## 2018-01-20 ENCOUNTER — Encounter (HOSPITAL_COMMUNITY): Payer: Self-pay | Admitting: General Surgery

## 2018-02-01 ENCOUNTER — Encounter: Payer: BC Managed Care – PPO | Admitting: Gynecology

## 2018-03-26 ENCOUNTER — Encounter: Payer: BC Managed Care – PPO | Admitting: Gynecology

## 2018-11-01 ENCOUNTER — Other Ambulatory Visit: Payer: Self-pay | Admitting: Gynecology

## 2018-11-01 DIAGNOSIS — Z1231 Encounter for screening mammogram for malignant neoplasm of breast: Secondary | ICD-10-CM

## 2018-12-17 ENCOUNTER — Ambulatory Visit: Payer: BC Managed Care – PPO

## 2018-12-27 ENCOUNTER — Ambulatory Visit
Admission: RE | Admit: 2018-12-27 | Discharge: 2018-12-27 | Disposition: A | Discharge status: Home / Self Care | Payer: BC Managed Care – PPO | Source: Ambulatory Visit | Attending: Gynecology | Admitting: Gynecology

## 2018-12-27 DIAGNOSIS — Z1231 Encounter for screening mammogram for malignant neoplasm of breast: Secondary | ICD-10-CM

## 2018-12-28 ENCOUNTER — Other Ambulatory Visit: Payer: Self-pay | Admitting: Gynecology

## 2018-12-28 DIAGNOSIS — R928 Other abnormal and inconclusive findings on diagnostic imaging of breast: Secondary | ICD-10-CM

## 2019-01-02 ENCOUNTER — Ambulatory Visit
Admission: RE | Admit: 2019-01-02 | Discharge: 2019-01-02 | Disposition: A | Discharge status: Home / Self Care | Payer: BC Managed Care – PPO | Source: Ambulatory Visit | Attending: Gynecology | Admitting: Gynecology

## 2019-01-02 DIAGNOSIS — R928 Other abnormal and inconclusive findings on diagnostic imaging of breast: Secondary | ICD-10-CM

## 2019-02-01 ENCOUNTER — Ambulatory Visit: Payer: BC Managed Care – PPO | Admitting: Obstetrics & Gynecology

## 2019-02-01 ENCOUNTER — Encounter: Payer: Self-pay | Admitting: Obstetrics & Gynecology

## 2019-02-01 VITALS — BP 118/76

## 2019-02-01 DIAGNOSIS — N898 Other specified noninflammatory disorders of vagina: Secondary | ICD-10-CM | POA: Diagnosis not present

## 2019-02-01 DIAGNOSIS — R350 Frequency of micturition: Secondary | ICD-10-CM

## 2019-02-01 LAB — WET PREP FOR TRICH, YEAST, CLUE

## 2019-02-01 MED ORDER — CIPROFLOXACIN HCL 500 MG PO TABS: 500.0000 mg | ORAL_TABLET | Freq: Two times a day (BID) | ORAL | 0 refills | Status: AC

## 2019-02-01 NOTE — Progress Notes (Signed)
    Sandy White May 16, 1961 [id number]        58 y.o.  G4P4L4 Married  RP: Burning with urination and urinary frequency  HPI: Patient felt suprapubic discomfort with frequency of urination/burning with miction 2 weeks ago.  Treated with MacroBID, now 5 days post finishing the treatment, the symptoms are still present, never got completely well.  No fever.  Mild vaginal itching.  No Cystitis in many years before that recent episode.   OB History  Gravida Para Term Preterm AB Living  4 4 4     4   SAB TAB Ectopic Multiple Live Births               # Outcome Date GA Lbr Len/2nd Weight Sex Delivery Anes PTL Lv  4 Term           3 Term           2 Term           1 Term             Past medical history,surgical history, problem list, medications, allergies, family history and social history were all reviewed and documented in the EPIC chart.   Directed ROS with pertinent positives and negatives documented in the history of present illness/assessment and plan.  Exam:  Vitals:   02/01/19 0903  BP: 118/76   General appearance:  Normal  Abdomen: Normal.  Non-tender in suprapubic area.  CVAT negative bilaterally  Gynecologic exam: Vulva normal.  Speculum:  Cervix/Vagina normal.  Secretions wnl.  Wet prep done.  Bimanual exam:  Uterus AV, normal volume, mobile, NT.  No adnexal mass, NT bilaterally.  U/A:  Dark yellow, cloudy, Protein negative, Nitrite negative, WBCs 10-20, RBC 0-2, Bacteria moderate.  Pending U. culture  Wet prep negative   Assessment/Plan:  58 y.o. [id number]   1. Frequency of urination Probable persistent cystitis after treatment with Macrodantin.  Urine analysis abnormal and symptoms of bladder infection persisting.  Will treat with ciprofloxacin 500 mg 1 tablet twice a day for 5 days.  No contraindication to ciprofloxacin.  Usage reviewed with patient and prescription sent to pharmacy.  Pending urine culture. - Urinalysis,Complete w/RFL Culture  2. Vaginal  itching Wet prep negative, patient reassured. - WET PREP FOR [place], YEAST, CLUE  Other orders - ciprofloxacin (CIPRO) 500 MG tablet; Take 1 tablet (500 mg total) by mouth 2 (two) times daily for 5 days.  Counseling on above issues and coordination of care more than 50% for 25 minutes.  Princess Bruins MD, 9:14 AM 02/01/2019

## 2019-02-01 NOTE — Patient Instructions (Addendum)
1. Frequency of urination Probable persistent cystitis after treatment with Macrodantin.  Urine analysis abnormal and symptoms of bladder infection persisting.  Will treat with ciprofloxacin 500 mg 1 tablet twice a day for 5 days.  No contraindication to ciprofloxacin.  Usage reviewed with patient and prescription sent to pharmacy.  Pending urine culture. - Urinalysis,Complete w/RFL Culture  2. Vaginal itching Wet prep negative, patient reassured. - WET PREP FOR [place], YEAST, CLUE  Other orders - ciprofloxacin (CIPRO) 500 MG tablet; Take 1 tablet (500 mg total) by mouth 2 (two) times daily for 5 days.  Sandy White, it was a pleasure meeting you today!  I will inform you of your results as soon as they are available.

## 2019-02-03 LAB — URINALYSIS, COMPLETE W/RFL CULTURE
Bilirubin Urine: NEGATIVE
Glucose, UA: NEGATIVE
Hyaline Cast: NONE SEEN /LPF
Ketones, ur: NEGATIVE
NITRITES URINE, INITIAL: NEGATIVE
PH: 5.5 (ref 5.0–8.0)
PROTEIN: NEGATIVE
Specific Gravity, Urine: 1.025 (ref 1.001–1.03)

## 2019-02-03 LAB — URINE CULTURE
MICRO NUMBER:: 201940
RESULT: NO GROWTH
SPECIMEN QUALITY:: ADEQUATE

## 2019-02-03 LAB — CULTURE INDICATED

## 2019-02-18 ENCOUNTER — Encounter (HOSPITAL_COMMUNITY): Payer: Self-pay

## 2019-02-18 ENCOUNTER — Emergency Department (HOSPITAL_COMMUNITY)
Admission: EM | Admit: 2019-02-18 | Discharge: 2019-02-18 | Disposition: A | Discharge status: Home / Self Care | Payer: BC Managed Care – PPO | Attending: Emergency Medicine | Admitting: Emergency Medicine

## 2019-02-18 ENCOUNTER — Other Ambulatory Visit: Payer: Self-pay

## 2019-02-18 DIAGNOSIS — Z79899 Other long term (current) drug therapy: Secondary | ICD-10-CM | POA: Diagnosis not present

## 2019-02-18 DIAGNOSIS — E860 Dehydration: Secondary | ICD-10-CM | POA: Insufficient documentation

## 2019-02-18 DIAGNOSIS — E039 Hypothyroidism, unspecified: Secondary | ICD-10-CM | POA: Diagnosis not present

## 2019-02-18 DIAGNOSIS — R112 Nausea with vomiting, unspecified: Secondary | ICD-10-CM | POA: Insufficient documentation

## 2019-02-18 DIAGNOSIS — Z85828 Personal history of other malignant neoplasm of skin: Secondary | ICD-10-CM | POA: Diagnosis not present

## 2019-02-18 DIAGNOSIS — R197 Diarrhea, unspecified: Secondary | ICD-10-CM | POA: Diagnosis not present

## 2019-02-18 LAB — CBC
HEMATOCRIT: 52.8 % — AB (ref 36.0–46.0)
Hemoglobin: 17 g/dL — ABNORMAL HIGH (ref 12.0–15.0)
MCH: 28.7 pg (ref 26.0–34.0)
MCHC: 32.2 g/dL (ref 30.0–36.0)
MCV: 89.2 fL (ref 80.0–100.0)
Platelets: 283 10*3/uL (ref 150–400)
RBC: 5.92 MIL/uL — ABNORMAL HIGH (ref 3.87–5.11)
RDW: 13.3 % (ref 11.5–15.5)
WBC: 14.2 10*3/uL — AB (ref 4.0–10.5)
nRBC: 0 % (ref 0.0–0.2)

## 2019-02-18 LAB — COMPREHENSIVE METABOLIC PANEL
ALBUMIN: 4.9 g/dL (ref 3.5–5.0)
ALT: 38 U/L (ref 0–44)
AST: 32 U/L (ref 15–41)
Alkaline Phosphatase: 123 U/L (ref 38–126)
Anion gap: 11 (ref 5–15)
BUN: 29 mg/dL — AB (ref 6–20)
CHLORIDE: 106 mmol/L (ref 98–111)
CO2: 24 mmol/L (ref 22–32)
Calcium: 9.9 mg/dL (ref 8.9–10.3)
Creatinine, Ser: 0.91 mg/dL (ref 0.44–1.00)
GFR calc Af Amer: >60 mL/min (ref >60)
GFR calc non Af Amer: >60 mL/min (ref >60)
Glucose, Bld: 190 mg/dL — ABNORMAL HIGH (ref 70–99)
Potassium: 4 mmol/L (ref 3.5–5.1)
SODIUM: 141 mmol/L (ref 135–145)
Total Bilirubin: 0.9 mg/dL (ref 0.3–1.2)
Total Protein: 8.2 g/dL — ABNORMAL HIGH (ref 6.5–8.1)

## 2019-02-18 LAB — URINALYSIS, ROUTINE W REFLEX MICROSCOPIC
BILIRUBIN URINE: NEGATIVE
Glucose, UA: NEGATIVE mg/dL
HGB URINE DIPSTICK: NEGATIVE
Ketones, ur: NEGATIVE mg/dL
Leukocytes,Ua: NEGATIVE
NITRITE: NEGATIVE
PH: 5 (ref 5.0–8.0)
Protein, ur: NEGATIVE mg/dL
Specific Gravity, Urine: 1.02 (ref 1.005–1.030)

## 2019-02-18 LAB — LIPASE, BLOOD: LIPASE: 28 U/L (ref 11–51)

## 2019-02-18 MED ORDER — METOCLOPRAMIDE HCL 5 MG/ML IJ SOLN
10.0000 mg | Freq: Once | INTRAMUSCULAR | Status: AC
  Administered 2019-02-18: 10 mg via INTRAVENOUS
  Filled 2019-02-18: qty 2

## 2019-02-18 MED ORDER — SODIUM CHLORIDE 0.9 % IV BOLUS
1000.0000 mL | Freq: Once | INTRAVENOUS | Status: AC
  Administered 2019-02-18: 1000 mL via INTRAVENOUS

## 2019-02-18 MED ORDER — LOPERAMIDE HCL 2 MG PO CAPS
4.0000 mg | ORAL_CAPSULE | Freq: Once | ORAL | Status: AC
  Administered 2019-02-18: 4 mg via ORAL
  Filled 2019-02-18: qty 2

## 2019-02-18 MED ORDER — METOCLOPRAMIDE HCL 10 MG PO TABS: 10.0000 mg | ORAL_TABLET | Freq: Four times a day (QID) | ORAL | 0 refills | Status: DC | PRN

## 2019-02-18 MED ORDER — DIPHENHYDRAMINE HCL 50 MG/ML IJ SOLN
25.0000 mg | Freq: Once | INTRAMUSCULAR | Status: AC
  Administered 2019-02-18: 25 mg via INTRAVENOUS
  Filled 2019-02-18: qty 1

## 2019-02-18 MED ORDER — ONDANSETRON 4 MG PO TBDP
4.0000 mg | ORAL_TABLET | Freq: Once | ORAL | Status: AC | PRN
  Administered 2019-02-18: 4 mg via ORAL
  Filled 2019-02-18: qty 1

## 2019-02-18 NOTE — ED Provider Notes (Signed)
[facility] Provider Note   CSN: [id number] Arrival date & time: 02/18/19  0016    History   Chief Complaint Chief Complaint  Patient presents with  . Emesis  . Diarrhea    HPI Sandy White is a 58 y.o. female.   The history is provided by the patient.  She has history of hypothyroidism, GERD, melanoma and comes in because of vomiting and diarrhea.  She had sudden onset of vomiting and diarrhea at about 8 PM.  She has had numerous episodes of emesis and diarrhea.  There is abdominal cramping.  She denies fever or chills.  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  She had not eaten any suspicious foods.  There was no treatment given at home.  Past Medical History:  Diagnosis Date  . Anemia due to chronic blood loss    heavy uterine bleeding ---  02-03-2017  transfused 2 units RBS's for HG 6.8  . Constipation    due to iron supplement  . DUB (dysfunctional uterine bleeding)   . Endometrial polyp   . GERD (gastroesophageal reflux disease)   . History of basal cell carcinoma excision   . History of malignant melanoma of skin per pt no recurrence   09/ 2013--  Clark's level IV (T2b, N0)---  s/p  wide excision melanoma right upper shoulder region on back [facility])  . HOH (hard of hearing)    IN RIGHT EAR BUT ITS NOT THAT BAD   . Hypothyroidism   . Migraine   . PONV (postoperative nausea and vomiting)   . Thyroid nodule    per pt benign biospy  . Uterine fibroid   . Wears glasses     Patient Active Problem List   Diagnosis Date Noted  . IUD (intrauterine device) in place 03/20/2017  . Ovarian cyst, left 02/06/2017  . DUB (dysfunctional uterine bleeding) 02/06/2017  . Anemia due to chronic blood loss 01/31/2017  . Alkaline phosphatase elevation 02/20/2015  . Dysmenorrhea 02/05/2015  . Intramural leiomyoma of uterus 02/05/2015  . Family history of breast cancer 02/05/2015  . OAB (overactive  bladder) 02/05/2015  . Hypothyroidism 04/04/2012  . Interstitial cystitis 04/04/2012  . Migraine     Past Surgical History:  Procedure Laterality Date  . APPENDECTOMY  age 89  . BREAST EXCISIONAL BIOPSY Right   . CESAREAN SECTION W/BTL  10/27/2005  . CHOLECYSTECTOMY N/A 01/19/2018   Procedure: LAPAROSCOPIC CHOLECYSTECTOMY;  Surgeon: Rolm Bookbinder, MD;  Location: [facility];  Service: General;  Laterality: N/A;  . DILATATION & CURETTAGE/HYSTEROSCOPY WITH MYOSURE N/A 02/16/2017   Procedure: [place];  Surgeon: Terrance Mass, MD;  Location: [facility];  Service: Gynecology;  Laterality: N/A;  request 1:00pm OR time  Requesting one hour OR time  Dr. Moshe Salisbury will bring the Mirena IUD with him.  Marland Kitchen EXCISION OF SKIN TAG Left 02/16/2017   Procedure: EXCISION OF SKIN TAG;  Surgeon: Terrance Mass, MD;  Location[facility];  Service: Gynecology;  Laterality: Left;  . EXCISIONAL BIOPSY RIGHT BREAST MASS  06/2013   atypical ductal hyperplasia  . TONSILLECTOMY  age 56  . WIDE LOCAL EXCISION MELANOMA RIGHT UPPER SHOULDER REGION/  LYMPH NODE DISSECTION'S/  SKIN GRAFT  09/ 2013   [facility]  . WISDOM TOOTH EXTRACTION  1990's     OB History    Gravida  4   Para  4  Term  4   Preterm      AB      Living  4     SAB      TAB      Ectopic      Multiple      Live Births               Home Medications    Prior to Admission medications   Medication Sig Start Date End Date Taking? Authorizing Provider  acetaminophen (TYLENOL) 500 MG tablet Take 1,000 mg by mouth every 6 (six) hours as needed for mild pain or moderate pain.     [provider]  escitalopram (LEXAPRO) 10 MG tablet Take 10 mg by mouth at bedtime.  12/05/16   [provider]  Flaxseed, Linseed, (FLAXSEED OIL) 1000 MG CAPS Take 1,000 mg by mouth every other day.    [provider]  levonorgestrel (MIRENA) 20 MCG/24HR IUD 1  each by Intrauterine route once. 02/24/17   [provider]  levothyroxine (SYNTHROID, LEVOTHROID) 50 MCG tablet Take 50 mcg by mouth daily before breakfast.    [provider]  VITAMIN E PO Take 650 Units by mouth daily.    [provider]    Family History Family History  Problem Relation Age of Onset  . Breast cancer Mother   . Heart disease Father   . Breast cancer Sister        had doublem mastectomy  . Breast cancer Maternal Grandmother   . Breast cancer Sister     Social History Social History   Tobacco Use  . Smoking status: Never Smoker  . Smokeless tobacco: Never Used  Substance Use Topics  . Alcohol use: No    Alcohol/week: 0.0 standard drinks  . Drug use: No     Allergies   Codeine   Review of Systems Review of Systems  All other systems reviewed and are negative.    Physical Exam Updated Vital Signs BP (!) 107/48 (BP Location: Right Arm)   Pulse 92   Temp 97.6 F (36.4 C) (Oral)   Resp 17   Ht 5\' 2"  (1.575 m)   Wt 64.4 kg   SpO2 100%   BMI 25.97 kg/m   Physical Exam Vitals signs and nursing note reviewed.    58 year old female, resting comfortably and in no acute distress. Vital signs are normal. Oxygen saturation is 100%, which is normal. Head is normocephalic and atraumatic. PERRLA, EOMI. Oropharynx is clear. Neck is nontender and supple without adenopathy or JVD. Back is nontender and there is no CVA tenderness. Lungs are clear without rales, wheezes, or rhonchi. Chest is nontender. Heart has regular rate and rhythm without murmur. Abdomen is soft, flat, nontender without masses or hepatosplenomegaly and peristalsis is hypoactive. Extremities have no cyanosis or edema, full range of motion is present. Skin is warm and dry without rash. Neurologic: Mental status is normal, cranial nerves are intact, there are no motor or sensory deficits.  ED Treatments / Results  Labs (all labs ordered are listed, but only  abnormal results are displayed) Labs Reviewed  COMPREHENSIVE METABOLIC PANEL - Abnormal; Notable for the following components:      Result Value   Glucose, Bld 190 (*)    BUN 29 (*)    Total Protein 8.2 (*)    All other components within normal limits  CBC - Abnormal; Notable for the following components:   WBC 14.2 (*)    RBC  5.92 (*)    Hemoglobin 17.0 (*)    HCT 52.8 (*)    All other components within normal limits  URINALYSIS, ROUTINE W REFLEX MICROSCOPIC - Abnormal; Notable for the following components:   APPearance HAZY (*)    All other components within normal limits  LIPASE, BLOOD   Procedures Procedures   Medications Ordered in ED Medications  sodium chloride 0.9 % bolus 1,000 mL (has no administration in time range)  metoCLOPramide (REGLAN) injection 10 mg (has no administration in time range)  diphenhydrAMINE (BENADRYL) injection 25 mg (has no administration in time range)  ondansetron (ZOFRAN-ODT) disintegrating tablet 4 mg (4 mg Oral Given 02/18/19 0033)  sodium chloride 0.9 % bolus 1,000 mL (0 mLs Intravenous Stopped 02/18/19 0234)  loperamide (IMODIUM) capsule 4 mg (4 mg Oral Given 02/18/19 0132)     Initial Impression / Assessment and Plan / ED Course  I have reviewed the triage vital signs and the nursing notes.  Pertinent lab results that were available during my care of the patient were reviewed by me and considered in my medical decision making (see chart for details).  Nausea, vomiting, diarrhea and pattern strongly suggestive of viral gastroenteritis, possible food poisoning.  No red flags to suggest more serious pathology.  She had been given IV fluids, ondansetron, loperamide and states that nausea is only slightly improved but diarrhea seems to have resolved.  Labs show increased BUN and increased hemoglobin suggesting dehydration.  She will be given additional IV fluids and is given metoclopramide for nausea.  Old records are reviewed, and she has no relevant  past visits.  She feels much better after above-noted treatment.  She is given a prescription for metoclopramide and discharged.  Advised use over-the-counter loperamide as needed for diarrhea.  Return precautions discussed.  Final Clinical Impressions(s) / ED Diagnoses   Final diagnoses:  Nausea vomiting and diarrhea  Dehydration    ED Discharge Orders         Ordered    metoCLOPramide (REGLAN) 10 MG tablet  Every 6 hours PRN     02/18/19 2633           Delora Fuel, MD 35/45/62 [phone]

## 2019-02-18 NOTE — ED Triage Notes (Signed)
Pt reports abdominal pain, vomiting and diarrhea that started about two hours ago, numerous episodes of both. Pt unable to keep anything down.

## 2019-02-18 NOTE — Discharge Instructions (Addendum)
Take loperamide (Imodium A-D) as needed for diarrhea. ?

## 2019-04-08 ENCOUNTER — Other Ambulatory Visit: Payer: Self-pay

## 2019-04-10 ENCOUNTER — Encounter: Payer: BC Managed Care – PPO | Admitting: Obstetrics & Gynecology

## 2019-06-04 ENCOUNTER — Other Ambulatory Visit: Payer: Self-pay

## 2019-06-05 ENCOUNTER — Encounter: Payer: BC Managed Care – PPO | Admitting: Obstetrics & Gynecology

## 2019-09-17 ENCOUNTER — Encounter: Payer: Self-pay | Admitting: Gynecology

## 2020-01-08 ENCOUNTER — Other Ambulatory Visit: Payer: Self-pay | Admitting: Obstetrics & Gynecology

## 2020-01-08 DIAGNOSIS — Z1231 Encounter for screening mammogram for malignant neoplasm of breast: Secondary | ICD-10-CM

## 2020-01-10 ENCOUNTER — Ambulatory Visit: Payer: BC Managed Care – PPO

## 2020-02-06 ENCOUNTER — Ambulatory Visit: Payer: BC Managed Care – PPO

## 2020-03-09 ENCOUNTER — Ambulatory Visit: Payer: BC Managed Care – PPO | Attending: Internal Medicine

## 2020-03-12 ENCOUNTER — Other Ambulatory Visit: Payer: Self-pay

## 2020-03-12 ENCOUNTER — Ambulatory Visit
Admission: RE | Admit: 2020-03-12 | Discharge: 2020-03-12 | Disposition: A | Discharge status: Home / Self Care | Payer: BC Managed Care – PPO | Source: Ambulatory Visit | Attending: Obstetrics & Gynecology | Admitting: Obstetrics & Gynecology

## 2020-03-12 DIAGNOSIS — Z1231 Encounter for screening mammogram for malignant neoplasm of breast: Secondary | ICD-10-CM

## 2020-03-17 ENCOUNTER — Encounter: Payer: Self-pay | Admitting: Obstetrics & Gynecology

## 2020-03-17 ENCOUNTER — Ambulatory Visit: Payer: BC Managed Care – PPO | Admitting: Obstetrics & Gynecology

## 2020-03-17 ENCOUNTER — Other Ambulatory Visit: Payer: Self-pay

## 2020-03-17 VITALS — BP 140/90 | Ht 62.5 in | Wt 151.0 lb

## 2020-03-17 DIAGNOSIS — Z01419 Encounter for gynecological examination (general) (routine) without abnormal findings: Secondary | ICD-10-CM

## 2020-03-17 DIAGNOSIS — Z30431 Encounter for routine checking of intrauterine contraceptive device: Secondary | ICD-10-CM

## 2020-03-17 DIAGNOSIS — N950 Postmenopausal bleeding: Secondary | ICD-10-CM

## 2020-03-17 DIAGNOSIS — N939 Abnormal uterine and vaginal bleeding, unspecified: Secondary | ICD-10-CM | POA: Diagnosis not present

## 2020-03-17 DIAGNOSIS — Z780 Asymptomatic menopausal state: Secondary | ICD-10-CM

## 2020-03-17 NOTE — Progress Notes (Signed)
Sandy White 1961-04-01 [id number]   History:    59 y.o. G4P4L4 Married.  Teacher.  RP:  Established patient presenting for annual gyn exam   HPI: Mirena IUD for heavy periods x 03/20/2017.  No menses with it except for very mild brown spotting x 3 days last week.  No pelvic pain.  No pain with IC.  Urine/BMs normal.  Breasts normal.  BMI 27.18.  Will f/u here for Fasting Health Labs.  Past medical history,surgical history, family history and social history were all reviewed and documented in the EPIC chart.  Gynecologic History No LMP recorded. (Menstrual status: IUD).  Obstetric History OB History  Gravida Para Term Preterm AB Living  _0 SAB TAB Ectopic Multiple Live Births               # Outcome Date GA Lbr Len/2nd Weight Sex Delivery Anes PTL Lv  4 Term           3 Term           2 Term           1 Term              ROS: A ROS was performed and pertinent positives and negatives are included in the history.  GENERAL: No fevers or chills. HEENT: No change in vision, no earache, sore throat or sinus congestion. NECK: No pain or stiffness. CARDIOVASCULAR: No chest pain or pressure. No palpitations. PULMONARY: No shortness of breath, cough or wheeze. GASTROINTESTINAL: No abdominal pain, nausea, vomiting or diarrhea, melena or bright red blood per rectum. GENITOURINARY: No urinary frequency, urgency, hesitancy or dysuria. MUSCULOSKELETAL: No joint or muscle pain, no back pain, no recent trauma. DERMATOLOGIC: No rash, no itching, no lesions. ENDOCRINE: No polyuria, polydipsia, no heat or cold intolerance. No recent change in weight. HEMATOLOGICAL: No anemia or easy bruising or bleeding. NEUROLOGIC: No headache, seizures, numbness, tingling or weakness. PSYCHIATRIC: No depression, no loss of interest in normal activity or change in sleep pattern.     Exam:   BP 140/90   Ht 5' 2.5" (1.588 m)   Wt 151 lb (68.5 kg)   BMI 27.18 kg/m   Body mass index is 27.18  kg/m.  General appearance : Well developed well nourished female. No acute distress HEENT: Eyes: no retinal hemorrhage or exudates,  Neck supple, trachea midline, no carotid bruits, no thyroidmegaly Lungs: Clear to auscultation, no rhonchi or wheezes, or rib retractions  Heart: Regular rate and rhythm, no murmurs or gallops Breast:Examined in sitting and supine position were symmetrical in appearance, no palpable masses or tenderness,  no skin retraction, no nipple inversion, no nipple discharge, no skin discoloration, no axillary or supraclavicular lymphadenopathy Abdomen: no palpable masses or tenderness, no rebound or guarding Extremities: no edema or skin discoloration or tenderness  Pelvic: Vulva: Normal             Vagina: No gross lesions or discharge  Cervix: No gross lesions or discharge.  IUD strings visible at [facility].  Pap test done.  Uterus  AV, normal size, shape and consistency, non-tender and mobile  Adnexa  Without masses or tenderness  Anus: Normal   Assessment/Plan:  59 y.o. female for annual exam   1. Encounter for routine gynecological examination with Papanicolaou smear of cervix Normal gynecologic exam.  Pap reflex done.  Breast exam normal.  Screening mammogram negative in March 2021.  Needs to schedule  a screening colonoscopy.  We will follow-up here for fasting health labs. - CBC; Future - Comp Met (CMET); Future - TSH; Future - Lipid panel; Future - VITAMIN D 25 Hydroxy (Vit-D Deficiency, Fractures); Future  2. IUD check up Well on Mirena IUD.  IUD in good position with strings visible at the external os.  Had spotting for 3 days recently, will follow up with a pelvic ultrasound to evaluate.  3. Menopause Probably menopausal, will confirm with an [facility] when comes back for fasting labs.  Recommend vitamin D supplements, calcium intake of 1200 mg daily and regular weightbearing physical activities. - [place]; Future  4. Postmenopausal bleeding Vaginal bleeding  probably postmenopausal bleeding.  Follow-up pelvic ultrasound to evaluate the endometrial lining and rule out pathology at that level. - US Transvaginal Non-OB; Future  Other orders - vitamin B-12 (CYANOCOBALAMIN) 500 MCG tablet; Take 500 mcg by mouth daily. - cholecalciferol (VITAMIN D3) 25 MCG (1000 UNIT) tablet; Take 1,000 Units by mouth daily. - Multiple Vitamins-Minerals (AIRBORNE PO); Take by mouth. - Zinc 100 MG TABS; Take by mouth.  Princess Bruins MD, 3:45 PM 03/17/2020

## 2020-03-18 ENCOUNTER — Other Ambulatory Visit: Payer: BC Managed Care – PPO

## 2020-03-18 ENCOUNTER — Encounter: Payer: Self-pay | Admitting: Obstetrics & Gynecology

## 2020-03-18 DIAGNOSIS — Z01419 Encounter for gynecological examination (general) (routine) without abnormal findings: Secondary | ICD-10-CM

## 2020-03-18 DIAGNOSIS — Z780 Asymptomatic menopausal state: Secondary | ICD-10-CM

## 2020-03-18 LAB — COMPREHENSIVE METABOLIC PANEL
AG Ratio: 2 (calc) (ref 1.0–2.5)
ALT: 28 U/L (ref 6–29)
AST: 25 U/L (ref 10–35)
Albumin: 4.5 g/dL (ref 3.6–5.1)
Alkaline phosphatase (APISO): 130 U/L (ref 37–153)
BUN: 17 mg/dL (ref 7–25)
CO2: 26 mmol/L (ref 20–32)
Calcium: 9.4 mg/dL (ref 8.6–10.4)
Chloride: 107 mmol/L (ref 98–110)
Creat: 0.73 mg/dL (ref 0.50–1.05)
Globulin: 2.3 g/dL (calc) (ref 1.9–3.7)
Glucose, Bld: 94 mg/dL (ref 65–99)
Potassium: 4.4 mmol/L (ref 3.5–5.3)
Sodium: 142 mmol/L (ref 135–146)
Total Bilirubin: 0.5 mg/dL (ref 0.2–1.2)
Total Protein: 6.8 g/dL (ref 6.1–8.1)

## 2020-03-18 LAB — CBC
HCT: 45.6 % — ABNORMAL HIGH (ref 35.0–45.0)
Hemoglobin: 15.2 g/dL (ref 11.7–15.5)
MCH: 29.2 pg (ref 27.0–33.0)
MCHC: 33.3 g/dL (ref 32.0–36.0)
MCV: 87.5 fL (ref 80.0–100.0)
MPV: 10.1 fL (ref 7.5–12.5)
Platelets: 275 10*3/uL (ref 140–400)
RBC: 5.21 10*6/uL — ABNORMAL HIGH (ref 3.80–5.10)
RDW: 13.7 % (ref 11.0–15.0)
WBC: 5.8 10*3/uL (ref 3.8–10.8)

## 2020-03-18 LAB — LIPID PANEL
Cholesterol: 251 mg/dL — ABNORMAL HIGH (ref <200)
HDL: 32 mg/dL — ABNORMAL LOW (ref >=50)
LDL Cholesterol (Calc): 177 mg/dL (calc) — ABNORMAL HIGH
Non-HDL Cholesterol (Calc): 219 mg/dL (calc) — ABNORMAL HIGH (ref <130)
Total CHOL/HDL Ratio: 7.8 (calc) — ABNORMAL HIGH (ref <5.0)
Triglycerides: 254 mg/dL — ABNORMAL HIGH (ref <150)

## 2020-03-18 LAB — TSH: TSH: 0.5 mIU/L (ref 0.40–4.50)

## 2020-03-18 LAB — FOLLICLE STIMULATING HORMONE: FSH: 98.6 m[IU]/mL

## 2020-03-18 LAB — VITAMIN D 25 HYDROXY (VIT D DEFICIENCY, FRACTURES): Vit D, 25-Hydroxy: 16 ng/mL — ABNORMAL LOW (ref 30–100)

## 2020-03-18 NOTE — Patient Instructions (Signed)
1. Encounter for routine gynecological examination with Papanicolaou smear of cervix Normal gynecologic exam.  Pap reflex done.  Breast exam normal.  Screening mammogram negative in March 2021.  Needs to schedule a screening colonoscopy.  We will follow-up here for fasting health labs. - CBC; Future - Comp Met (CMET); Future - TSH; Future - Lipid panel; Future - VITAMIN D 25 Hydroxy (Vit-D Deficiency, Fractures); Future  2. IUD check up Well on Mirena IUD.  IUD in good position with strings visible at the external os.  Had spotting for 3 days recently, will follow up with a pelvic ultrasound to evaluate.  3. Menopause Probably menopausal, will confirm with an [facility] when comes back for fasting labs.  Recommend vitamin D supplements, calcium intake of 1200 mg daily and regular weightbearing physical activities. - [place]; Future  4. Postmenopausal bleeding Vaginal bleeding probably postmenopausal bleeding.  Follow-up pelvic ultrasound to evaluate the endometrial lining and rule out pathology at that level. - US Transvaginal Non-OB; Future  Other orders - vitamin B-12 (CYANOCOBALAMIN) 500 MCG tablet; Take 500 mcg by mouth daily. - cholecalciferol (VITAMIN D3) 25 MCG (1000 UNIT) tablet; Take 1,000 Units by mouth daily. - Multiple Vitamins-Minerals (AIRBORNE PO); Take by mouth. - Zinc 100 MG TABS; Take by mouth.  Corita, it was a pleasure seeing you today!  I will inform you of your results as soon as they are available.

## 2020-03-20 LAB — PAP IG W/ RFLX HPV ASCU

## 2020-03-23 ENCOUNTER — Encounter: Payer: Self-pay | Admitting: *Deleted

## 2020-03-23 DIAGNOSIS — E559 Vitamin D deficiency, unspecified: Secondary | ICD-10-CM

## 2020-03-24 MED ORDER — VITAMIN D (ERGOCALCIFEROL) 1.25 MG (50000 UNIT) PO CAPS: ORAL_CAPSULE | ORAL | 0 refills | Status: DC

## 2020-03-24 NOTE — Telephone Encounter (Signed)
Patient called back requesting Rx for vitamin d be sent to [facility].

## 2020-04-15 ENCOUNTER — Ambulatory Visit: Payer: BC Managed Care – PPO | Admitting: Obstetrics & Gynecology

## 2020-04-15 ENCOUNTER — Other Ambulatory Visit: Payer: Self-pay | Admitting: Obstetrics & Gynecology

## 2020-04-15 ENCOUNTER — Other Ambulatory Visit: Payer: Self-pay

## 2020-04-15 ENCOUNTER — Ambulatory Visit (INDEPENDENT_AMBULATORY_CARE_PROVIDER_SITE_OTHER): Payer: BC Managed Care – PPO

## 2020-04-15 DIAGNOSIS — N950 Postmenopausal bleeding: Secondary | ICD-10-CM

## 2020-04-15 DIAGNOSIS — R7989 Other specified abnormal findings of blood chemistry: Secondary | ICD-10-CM

## 2020-04-15 DIAGNOSIS — N854 Malposition of uterus: Secondary | ICD-10-CM

## 2020-04-15 DIAGNOSIS — Z30431 Encounter for routine checking of intrauterine contraceptive device: Secondary | ICD-10-CM

## 2020-04-15 DIAGNOSIS — D251 Intramural leiomyoma of uterus: Secondary | ICD-10-CM

## 2020-04-15 NOTE — Progress Notes (Signed)
    Sandy White [birth year] [id number]        59 y.o.  G4P4004   RP: PMB for Pelvic US  HPI: Has a Mirena IUD inserted 03/2017 for heavy periods.  No vaginal bleeding since then, until last week when she had 3 days of mild spotting.  No pelvic pain.   OB History  Gravida Para Term Preterm AB Living  4 4 4     4   SAB TAB Ectopic Multiple Live Births               # Outcome Date GA Lbr Len/2nd Weight Sex Delivery Anes PTL Lv  4 Term           3 Term           2 Term           1 Term             Past medical history,surgical history, problem list, medications, allergies, family history and social history were all reviewed and documented in the EPIC chart.   Directed ROS with pertinent positives and negatives documented in the history of present illness/assessment and plan.  Exam:  There were no vitals filed for this visit. General appearance:  Normal  Pelvic US today: T/A and T/V images.  Anteverted uterus with many small intramural fibroids measured from less than 1cm to 1.53 cm.  Many fibroids are calcified.  The overall uterine size is measured at 7.41 x 5.82 x 4.26 cm.  The IUD is noted in proper position within the intrauterine cavity.  No thickening or obvious mass adjacent to the IUD.  The endometrial lining is measured at 2.9 mm.  Both ovaries are small with atrophic appearance.  No adnexal mass.  No free fluid in the posterior cul-de-sac.   Assessment/Plan:  59 y.o. G4P4004   1. Postmenopausal bleeding Mild postmenopausal bleeding with Mirena IUD in place.  Pelvic ultrasound findings reviewed with patient.  Anteverted uterus with many small intramural fibroids all in the 1 cm or below range.  The overall uterine size is normal at 7.4 x 5.8 x 4.2 cm.  The IUD is in proper intrauterine position.  No thickening of the endometrium which is measured at 2.9 mm.  Both ovaries are normal with no adnexal mass and no free fluid in the posterior cul-de-sac.  Patient reassured.   Will observe.  2. IUD check up Mirena IUD in good position and well-tolerated since insertion April 2018.  We will continue with the Mirena IUD until reaching 5 years.  3. Low vitamin D level Vitamin D protocol.  Princess Bruins MD, 3:08 PM 04/15/2020

## 2020-04-17 ENCOUNTER — Encounter: Payer: Self-pay | Admitting: Obstetrics & Gynecology

## 2020-04-17 NOTE — Patient Instructions (Signed)
1. Postmenopausal bleeding Mild postmenopausal bleeding with Mirena IUD in place.  Pelvic ultrasound findings reviewed with patient.  Anteverted uterus with many small intramural fibroids all in the 1 cm or below range.  The overall uterine size is normal at 7.4 x 5.8 x 4.2 cm.  The IUD is in proper intrauterine position.  No thickening of the endometrium which is measured at 2.9 mm.  Both ovaries are normal with no adnexal mass and no free fluid in the posterior cul-de-sac.  Patient reassured.  Will observe.  2. IUD check up Mirena IUD in good position and well-tolerated since insertion April 2018.  We will continue with the Mirena IUD until reaching 5 years.  3. Low vitamin D level Vitamin D protocol.  Sandy White, it was a pleasure seeing you today!

## 2020-08-11 ENCOUNTER — Ambulatory Visit (HOSPITAL_COMMUNITY)
Admission: RE | Admit: 2020-08-11 | Discharge: 2020-08-11 | Disposition: A | Discharge status: Home / Self Care | Payer: BC Managed Care – PPO | Source: Ambulatory Visit | Attending: Internal Medicine | Admitting: Internal Medicine

## 2020-08-11 ENCOUNTER — Other Ambulatory Visit: Payer: Self-pay

## 2020-08-11 ENCOUNTER — Other Ambulatory Visit: Payer: Self-pay | Admitting: Internal Medicine

## 2020-08-11 DIAGNOSIS — N23 Unspecified renal colic: Secondary | ICD-10-CM

## 2021-02-01 ENCOUNTER — Other Ambulatory Visit: Payer: Self-pay | Admitting: Obstetrics & Gynecology

## 2021-02-01 DIAGNOSIS — Z1231 Encounter for screening mammogram for malignant neoplasm of breast: Secondary | ICD-10-CM

## 2021-03-18 ENCOUNTER — Encounter: Payer: BC Managed Care – PPO | Admitting: Obstetrics & Gynecology

## 2021-03-23 ENCOUNTER — Ambulatory Visit
Admission: RE | Admit: 2021-03-23 | Discharge: 2021-03-23 | Disposition: A | Discharge status: Home / Self Care | Payer: BC Managed Care – PPO | Source: Ambulatory Visit | Attending: Obstetrics & Gynecology | Admitting: Obstetrics & Gynecology

## 2021-03-23 ENCOUNTER — Other Ambulatory Visit: Payer: Self-pay

## 2021-03-23 DIAGNOSIS — Z1231 Encounter for screening mammogram for malignant neoplasm of breast: Secondary | ICD-10-CM

## 2021-05-03 ENCOUNTER — Emergency Department (HOSPITAL_COMMUNITY): Payer: BC Managed Care – PPO

## 2021-05-03 ENCOUNTER — Encounter (HOSPITAL_COMMUNITY): Payer: Self-pay | Admitting: *Deleted

## 2021-05-03 ENCOUNTER — Emergency Department (HOSPITAL_COMMUNITY)
Admission: EM | Admit: 2021-05-03 | Discharge: 2021-05-03 | Disposition: A | Discharge status: Home / Self Care | Payer: BC Managed Care – PPO | Attending: Emergency Medicine | Admitting: Emergency Medicine

## 2021-05-03 ENCOUNTER — Other Ambulatory Visit: Payer: Self-pay

## 2021-05-03 DIAGNOSIS — E039 Hypothyroidism, unspecified: Secondary | ICD-10-CM | POA: Insufficient documentation

## 2021-05-03 DIAGNOSIS — N201 Calculus of ureter: Secondary | ICD-10-CM

## 2021-05-03 DIAGNOSIS — N200 Calculus of kidney: Secondary | ICD-10-CM | POA: Insufficient documentation

## 2021-05-03 DIAGNOSIS — N23 Unspecified renal colic: Secondary | ICD-10-CM | POA: Insufficient documentation

## 2021-05-03 DIAGNOSIS — R109 Unspecified abdominal pain: Secondary | ICD-10-CM | POA: Diagnosis present

## 2021-05-03 DIAGNOSIS — Z79899 Other long term (current) drug therapy: Secondary | ICD-10-CM | POA: Insufficient documentation

## 2021-05-03 DIAGNOSIS — K219 Gastro-esophageal reflux disease without esophagitis: Secondary | ICD-10-CM | POA: Diagnosis not present

## 2021-05-03 DIAGNOSIS — N132 Hydronephrosis with renal and ureteral calculous obstruction: Secondary | ICD-10-CM

## 2021-05-03 HISTORY — DX: Calculus of kidney: N20.0

## 2021-05-03 MED ORDER — TAMSULOSIN HCL 0.4 MG PO CAPS
0.4000 mg | ORAL_CAPSULE | Freq: Once | ORAL | Status: AC
  Administered 2021-05-03: 0.4 mg via ORAL
  Filled 2021-05-03: qty 1

## 2021-05-03 MED ORDER — HYDROCODONE-ACETAMINOPHEN 5-325 MG PO TABS: 1.0000 | ORAL_TABLET | ORAL | 0 refills | Status: DC | PRN

## 2021-05-03 MED ORDER — HYDROCODONE-ACETAMINOPHEN 5-325 MG PO TABS
1.0000 | ORAL_TABLET | Freq: Once | ORAL | Status: DC
  Filled 2021-05-03: qty 1

## 2021-05-03 MED ORDER — TAMSULOSIN HCL 0.4 MG PO CAPS: ORAL_CAPSULE | ORAL | 0 refills | Status: DC

## 2021-05-03 MED ORDER — ONDANSETRON HCL 8 MG PO TABS: 8.0000 mg | ORAL_TABLET | ORAL | 0 refills | Status: DC | PRN

## 2021-05-03 MED ORDER — ONDANSETRON HCL 4 MG/2ML IJ SOLN
4.0000 mg | Freq: Once | INTRAMUSCULAR | Status: AC
Start: 2021-05-03 — End: 2021-05-03
  Administered 2021-05-03: 4 mg via INTRAVENOUS

## 2021-05-03 MED ORDER — ONDANSETRON HCL 4 MG/2ML IJ SOLN
4.0000 mg | Freq: Once | INTRAMUSCULAR | Status: AC
  Administered 2021-05-03: 4 mg via INTRAVENOUS
  Filled 2021-05-03: qty 2

## 2021-05-03 MED ORDER — FENTANYL CITRATE (PF) 100 MCG/2ML IJ SOLN
100.0000 ug | INTRAMUSCULAR | Status: DC | PRN
  Administered 2021-05-03 (×2): 100 ug via INTRAVENOUS
  Filled 2021-05-03 (×3): qty 2

## 2021-05-03 MED ORDER — KETOROLAC TROMETHAMINE 30 MG/ML IJ SOLN
15.0000 mg | Freq: Once | INTRAMUSCULAR | Status: AC
  Administered 2021-05-03: 15 mg via INTRAVENOUS
  Filled 2021-05-03: qty 1

## 2021-05-03 NOTE — ED Notes (Signed)
Patient incontinent of urine while vomiting, Pt provided hospital gown and belongings bag for wet clothing.

## 2021-05-03 NOTE — Discharge Instructions (Signed)
The pain which you are having is caused by a 3 mm stone in the left lower ureter.  This has a high likelihood of passing within the next 2 or 3 days.  Call the urologist for follow-up appointment.  Use the medicines as directed for pain control.

## 2021-05-03 NOTE — ED Provider Notes (Signed)
[facility] EMERGENCY DEPARTMENT Provider Note   CSN: [id number] Arrival date & time: 05/03/21  1603     History Chief Complaint  Patient presents with  . Flank Pain    RAELEY White is a 60 y.o. female.  HPI She presents for onset of left flank pain about an hour prior to arrival.  She states it feels just like a kidney stone.  Last passage of kidney stone about a year ago.  She denies fever, chills, weakness or dizziness.  She has noticed blood in her urine today.  She presents by private vehicle.  While in triage she laid on the floor, because she was in severe pain.  There are no other known active modifying factors.    Past Medical History:  Diagnosis Date  . Anemia due to chronic blood loss    heavy uterine bleeding ---  02-03-2017  transfused 2 units RBS's for HG 6.8  . Constipation    due to iron supplement  . DUB (dysfunctional uterine bleeding)   . Endometrial polyp   . GERD (gastroesophageal reflux disease)   . History of basal cell carcinoma excision   . History of malignant melanoma of skin per pt no recurrence   09/ 2013--  Clark's level IV (T2b, N0)---  s/p  wide excision melanoma right upper shoulder region on back [facility])  . HOH (hard of hearing)    IN RIGHT EAR BUT ITS NOT THAT BAD   . Hypothyroidism   . Kidney stones   . Migraine   . PONV (postoperative nausea and vomiting)   . Thyroid nodule    per pt benign biospy  . Uterine fibroid   . Wears glasses     Patient Active Problem List   Diagnosis Date Noted  . IUD (intrauterine device) in place 03/20/2017  . Ovarian cyst, left 02/06/2017  . DUB (dysfunctional uterine bleeding) 02/06/2017  . Anemia due to chronic blood loss 01/31/2017  . Alkaline phosphatase elevation 02/20/2015  . Dysmenorrhea 02/05/2015  . Intramural leiomyoma of uterus 02/05/2015  . Family history of breast cancer 02/05/2015  . OAB (overactive bladder) 02/05/2015  . Hypothyroidism 04/04/2012  . Interstitial cystitis  04/04/2012  . Migraine     Past Surgical History:  Procedure Laterality Date  . APPENDECTOMY  age 35  . BREAST BIOPSY Left    unsure of date  . BREAST EXCISIONAL BIOPSY Right    unc - unsure of date  . CESAREAN SECTION W/BTL  10/27/2005  . CHOLECYSTECTOMY N/A 01/19/2018   Procedure: LAPAROSCOPIC CHOLECYSTECTOMY;  Surgeon: Rolm Bookbinder, MD;  Location: [facility];  Service: General;  Laterality: N/A;  . DILATATION & CURETTAGE/HYSTEROSCOPY WITH MYOSURE N/A 02/16/2017   Procedure: [place];  Surgeon: Terrance Mass, MD;  Location: [facility];  Service: Gynecology;  Laterality: N/A;  request 1:00pm OR time  Requesting one hour OR time  Dr. Moshe Salisbury will bring the Mirena IUD with him.  Marland Kitchen EXCISION OF SKIN TAG Left 02/16/2017   Procedure: EXCISION OF SKIN TAG;  Surgeon: Terrance Mass, MD;  Location: [facility];  Service: Gynecology;  Laterality: Left;  . EXCISIONAL BIOPSY RIGHT BREAST MASS  06/2013   atypical ductal hyperplasia  . TONSILLECTOMY  age 18  . WIDE LOCAL EXCISION MELANOMA RIGHT UPPER SHOULDER REGION/  LYMPH NODE DISSECTION'S/  SKIN GRAFT  09/ 2013   [facility]  . WISDOM TOOTH EXTRACTION  1990's     OB History  Gravida  4   Para  4   Term  4   Preterm      AB      Living  4     SAB      IAB      Ectopic      Multiple      Live Births              Family History  Problem Relation Age of Onset  . Breast cancer Mother   . Heart disease Father   . Breast cancer Sister        had doublem mastectomy  . Breast cancer Maternal Grandmother   . Breast cancer Sister     Social History   Tobacco Use  . Smoking status: Never Smoker  . Smokeless tobacco: Never Used  Vaping Use  . Vaping Use: Never used  Substance Use Topics  . Alcohol use: No    Alcohol/week: 0.0 standard drinks  . Drug use: No    Home Medications Prior to Admission medications   Medication Sig Start Date End Date  Taking? Authorizing Provider  HYDROcodone-acetaminophen (NORCO/VICODIN) 5-325 MG tablet Take 1 tablet by mouth every 4 (four) hours as needed for moderate pain or severe pain. 05/03/21  Yes Daleen Bo, MD  HYDROcodone-acetaminophen (NORCO/VICODIN) 5-325 MG tablet Take 1 tablet by mouth every 4 (four) hours as needed for moderate pain or severe pain. 05/03/21  Yes Daleen Bo, MD  tamsulosin [facility]) 0.4 MG CAPS capsule 1 q HS to aid stone passage 05/03/21  Yes Daleen Bo, MD  acetaminophen (TYLENOL) 500 MG tablet Take 1,000 mg by mouth every 6 (six) hours as needed for mild pain or moderate pain.     [provider]  cholecalciferol (VITAMIN D3) 25 MCG (1000 UNIT) tablet Take 1,000 Units by mouth daily.    [provider]  escitalopram (LEXAPRO) 10 MG tablet Take 10 mg by mouth at bedtime.  12/05/16   [provider]  Flaxseed, Linseed, (FLAXSEED OIL) 1000 MG CAPS Take 1,000 mg by mouth every other day.    [provider]  levonorgestrel (MIRENA) 20 MCG/24HR IUD 1 each by Intrauterine route once. 02/24/17   [provider]  levothyroxine (SYNTHROID, LEVOTHROID) 50 MCG tablet Take 50 mcg by mouth daily before breakfast.    [provider]  Multiple Vitamins-Minerals (AIRBORNE PO) Take by mouth.    [provider]  vitamin B-12 (CYANOCOBALAMIN) 500 MCG tablet Take 500 mcg by mouth daily.    [provider]  Vitamin D, Ergocalciferol, (DRISDOL) 1.25 MG (50000 UNIT) CAPS capsule Take 1 tablet by mouth weekly for 12 weeks then schedule lab appointment to recheck vitamin d level. 03/24/20   Princess Bruins, MD  VITAMIN E PO Take 650 Units by mouth daily.    [provider]  Zinc 100 MG TABS Take by mouth.    [provider]    Allergies    Codeine  Review of Systems   Review of Systems  All other systems reviewed and are negative.   Physical Exam Updated Vital Signs BP 125/82   Pulse 65   Temp 98.3  F (36.8 C) (Oral)   Resp 18   SpO2 95%   Physical Exam Vitals and nursing note reviewed.  Constitutional:      General: She is in acute distress.     Appearance: She is well-developed. She is ill-appearing. She is not diaphoretic.  HENT:     Head:  Normocephalic and atraumatic.     Right Ear: External ear normal.     Left Ear: External ear normal.  Eyes:     Conjunctiva/sclera: Conjunctivae normal.     Pupils: Pupils are equal, round, and reactive to light.  Neck:     Trachea: Phonation normal.  Cardiovascular:     Rate and Rhythm: Normal rate.  Pulmonary:     Effort: Pulmonary effort is normal.  Abdominal:     General: There is no distension.  Musculoskeletal:        General: Normal range of motion.     Cervical back: Normal range of motion and neck supple.  Skin:    General: Skin is warm and dry.  Neurological:     Mental Status: She is alert and oriented to person, place, and time.     Cranial Nerves: No cranial nerve deficit.     Sensory: No sensory deficit.     Motor: No abnormal muscle tone.     Coordination: Coordination normal.  Psychiatric:        Mood and Affect: Mood normal.        Behavior: Behavior normal.        Thought Content: Thought content normal.        Judgment: Judgment normal.     ED Results / Procedures / Treatments   Labs (all labs ordered are listed, but only abnormal results are displayed) Labs Reviewed - No data to display  EKG None  Radiology CT Renal Stone Study  Result Date: 05/03/2021 CLINICAL DATA:  Left flank pain with nausea vomiting and hematuria EXAM: CT ABDOMEN AND PELVIS WITHOUT CONTRAST TECHNIQUE: Multidetector CT imaging of the abdomen and pelvis was performed following the standard protocol without IV contrast. COMPARISON:  CT 08/11/2020, 01/12/2018 FINDINGS: Lower chest: Lung bases demonstrate a stable right middle lobe pulmonary nodule. No acute consolidation or effusion. Hepatobiliary: Status post cholecystectomy.  Stable right hepatic cyst. No biliary dilatation. Pancreas: Unremarkable. No pancreatic ductal dilatation or surrounding inflammatory changes. Spleen: Normal in size without focal abnormality. Adrenals/Urinary Tract: Adrenal glands are normal. Mild left hydronephrosis and hydroureter, secondary to a 3 mm stone in the distal left ureter about a cm proximal to the left UVJ. Bladder is unremarkable Stomach/Bowel: Stomach is within normal limits. Appendix not well seen but no right lower quadrant inflammatory process. Diverticular disease of the left colon without acute wall thickening. No evidence of bowel wall thickening, distention, or inflammatory changes. Vascular/Lymphatic: Nonaneurysmal aorta. Minimal atherosclerosis. No suspicious nodes Reproductive: IUD in the uterus. Coarse flu lower uterine segment calcification. No adnexal mass Other: Negative for free air or free fluid Musculoskeletal: No acute or significant osseous findings. IMPRESSION: 1. Mild left hydronephrosis and hydroureter secondary to a 3 mm stone in the distal left ureter about a cm proximal to the left UVJ. 2. Colon diverticular disease without acute inflammatory process Electronically Signed   By: Donavan Foil M.D.   On: 05/03/2021 18:34    Procedures Procedures   Medications Ordered in ED Medications  fentaNYL (SUBLIMAZE) injection 100 mcg (100 mcg Intravenous Given 05/03/21 1914)  tamsulosin (FLOMAX) capsule 0.4 mg (has no administration in time range)  HYDROcodone-acetaminophen (NORCO/VICODIN) 5-325 MG per tablet 1 tablet (has no administration in time range)  ondansetron (ZOFRAN) injection 4 mg (4 mg Intravenous Given 05/03/21 1729)  ketorolac (TORADOL) 30 MG/ML injection 15 mg (15 mg Intravenous Given 05/03/21 1915)  ondansetron (ZOFRAN) injection 4 mg (4 mg Intravenous Given 05/03/21 1915)  ED Course  I have reviewed the triage vital signs and the nursing notes.  Pertinent labs & imaging results that were available  during my care of the patient were reviewed by me and considered in my medical decision making (see chart for details).  Clinical Course as of 05/03/21 2136  Mon May 03, 2021  5852 She appears more comfortable and states the pain is better but she is still uncomfortable and wants more medication. [EW]    Clinical Course User Index [EW] Daleen Bo, MD   MDM Rules/Calculators/A&P                           Patient Vitals for the past 24 hrs:  BP Temp Temp src Pulse Resp SpO2  05/03/21 2100 125/82 -- -- 65 18 95 %  05/03/21 2000 120/82 -- -- 66 20 93 %  05/03/21 1930 136/85 -- -- 71 -- 93 %  05/03/21 1916 (!) 146/97 -- -- 70 20 99 %  05/03/21 1900 (!) 148/90 -- -- 69 -- 98 %  05/03/21 1830 (!) 145/93 -- -- 74 -- 99 %  05/03/21 1823 (!) 144/89 -- -- 73 16 100 %  05/03/21 1701 134/89 98.3 F (36.8 C) Oral 70 16 100 %    9:36 PM Reevaluation with update and discussion. After initial assessment and treatment, an updated evaluation reveals her pain is controlled at this time.  Findings discussed with the patient and her husband, all questions were answered. Daleen Bo   Medical Decision Making:  This patient is presenting for evaluation of left flank pain, which does require a range of treatment options, and is a complaint that involves a moderate risk of morbidity and mortality. The differential diagnoses include ureteral colic, kidney infection. I decided to review old records, and in summary middle-aged female presenting with pain typical of kidney stone obstruction.  I did not require additional historical information from anyone.   Radiologic Tests Ordered, included CT abdomen pelvis renal.  I independently Visualized: Radiograph images, which show obstructing distal left ureter stone    Critical Interventions-clinical evaluation, radiography, medication treatment, observation  After These Interventions, the Patient was reevaluated and was found improved pain, without  signs of UTI, stable for discharge.  CRITICAL CARE-no Performed by: Daleen Bo  Nursing Notes Reviewed/ Care Coordinated Applicable Imaging Reviewed Interpretation of Laboratory Data incorporated into ED treatment  The patient appears reasonably screened and/or stabilized for discharge and I doubt any other medical condition or other [facility] requiring further screening, evaluation, or treatment in the ED at this time prior to discharge.  Plan: Home Medications-continue routine; Home Treatments-gradual advance diet; return here if the recommended treatment, does not improve the symptoms; Recommended follow up-urology follow-up 1 week and as needed     Final Clinical Impression(s) / ED Diagnoses Final diagnoses:  Ureterolithiasis  Ureteral colic  Hydronephrosis with urinary obstruction due to renal calculus    Rx / DC Orders ED Discharge Orders         Ordered    HYDROcodone-acetaminophen (NORCO/VICODIN) 5-325 MG tablet  Every 4 hours PRN        05/03/21 2133    HYDROcodone-acetaminophen (NORCO/VICODIN) 5-325 MG tablet  Every 4 hours PRN        05/03/21 2133    tamsulosin (FLOMAX) 0.4 MG CAPS capsule        05/03/21 2133           Daleen Bo, MD 05/04/21  0020  

## 2021-05-03 NOTE — ED Notes (Signed)
Pt vomitimg, Dr. Eulis Foster notified.

## 2021-05-03 NOTE — ED Triage Notes (Signed)
Pt with left flank pain since this morning, worse over last couple of hours.  Pt with hx of kidney stones.  + n/v with hematuria.

## 2021-05-03 NOTE — ED Notes (Addendum)
Reported by screener that pt put herself in the floor. CN notified

## 2021-05-04 MED FILL — Hydrocodone-Acetaminophen Tab 5-325 MG: ORAL | Qty: 6 | Status: AC

## 2021-05-24 IMAGING — CT CT RENAL STONE PROTOCOL
2 of 4 series · 16 of 46 positions shown, 18 images · non-contrast
Comparison: CT 08/11/2020, 01/12/2018

CLINICAL DATA: Left flank pain with nausea vomiting and hematuria

EXAM:
CT ABDOMEN AND PELVIS WITHOUT CONTRAST
TECHNIQUE: Multidetector CT imaging of the abdomen and pelvis was performed
following the standard protocol without IV contrast.

[Series 2: axial st · axial · 0.84mm/px · z∈[+582,+1002]mm · 13 of 94 slices shown, 15 images]
[im 5/94  soft-tissue]
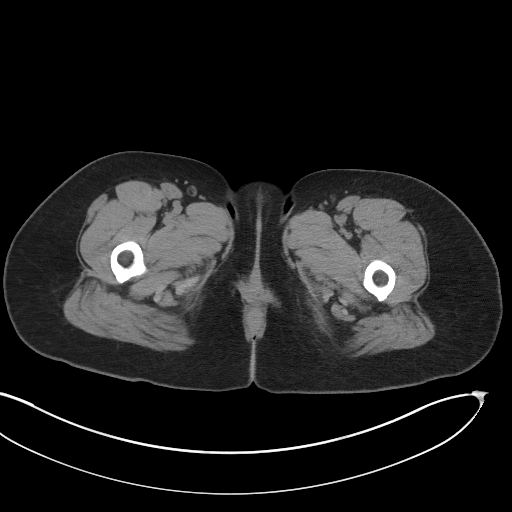
[im 5/94  bone]
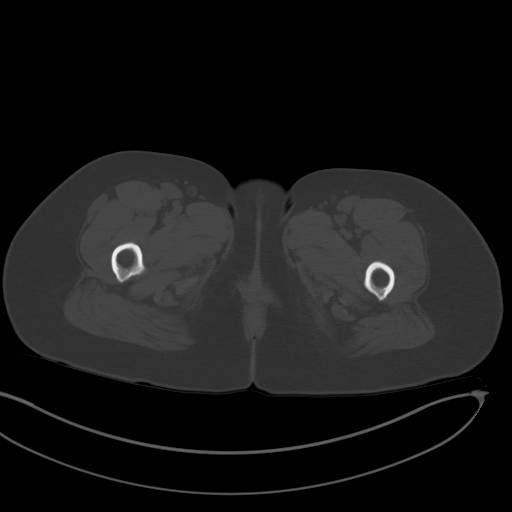
[im 15/94  soft-tissue]
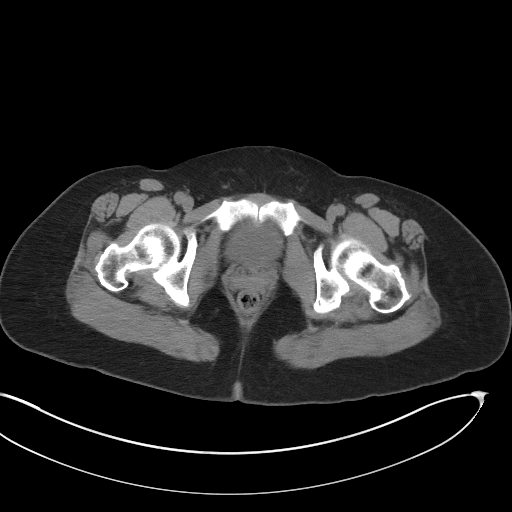
[im 20/94  soft-tissue]
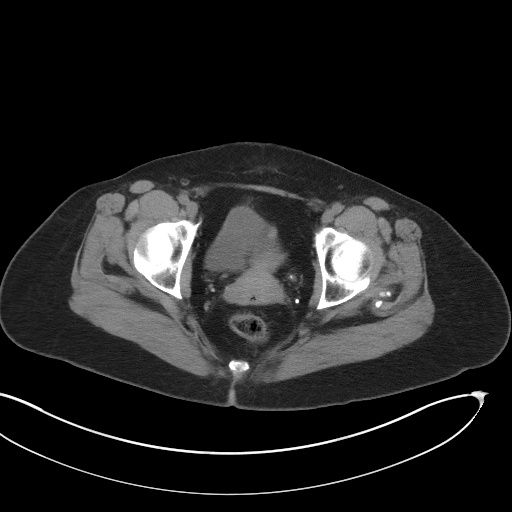
[im 25/94  soft-tissue]
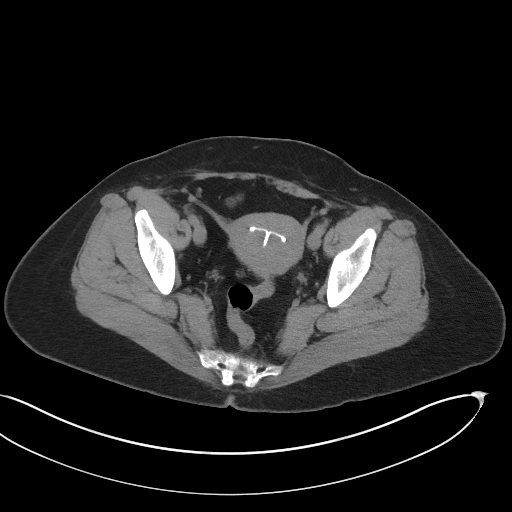
[im 35/94  soft-tissue]
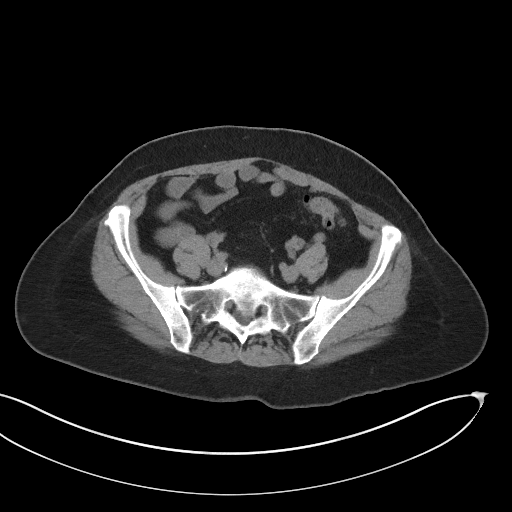
[im 40/94  soft-tissue]
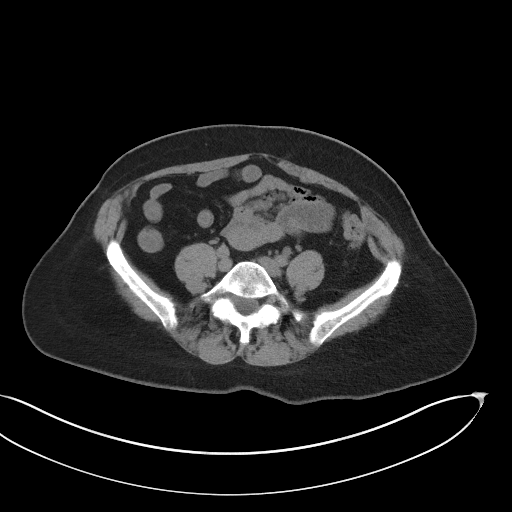
[im 49/94  soft-tissue]
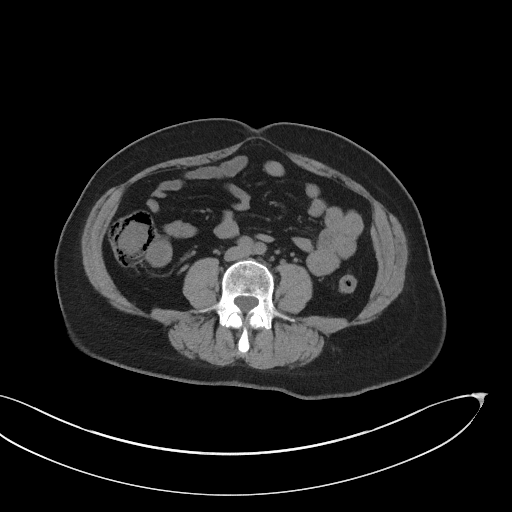
[im 54/94  soft-tissue]
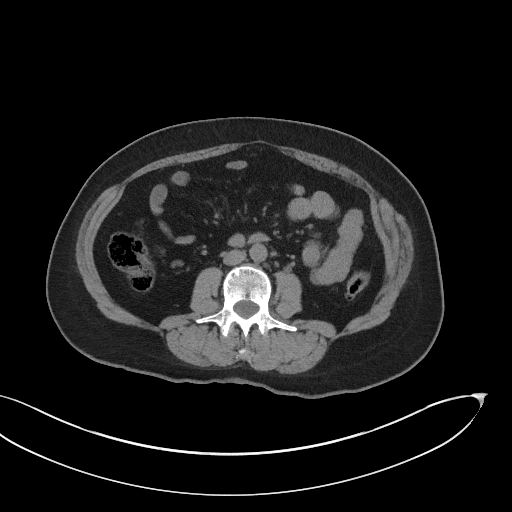
[im 59/94  soft-tissue]
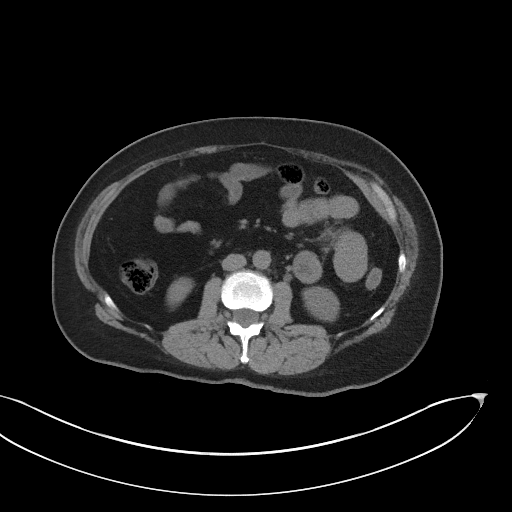
[im 59/94  bone]
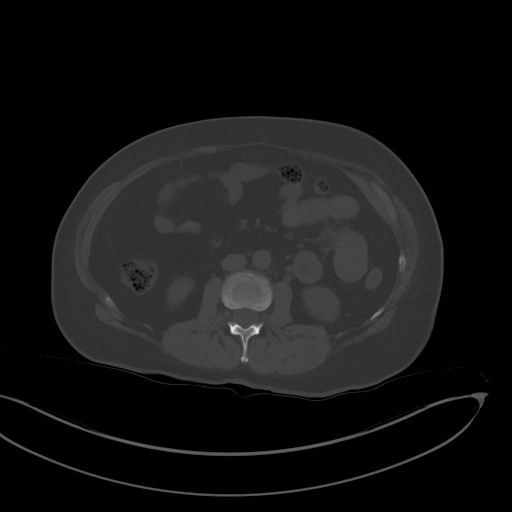
[im 69/94  soft-tissue]
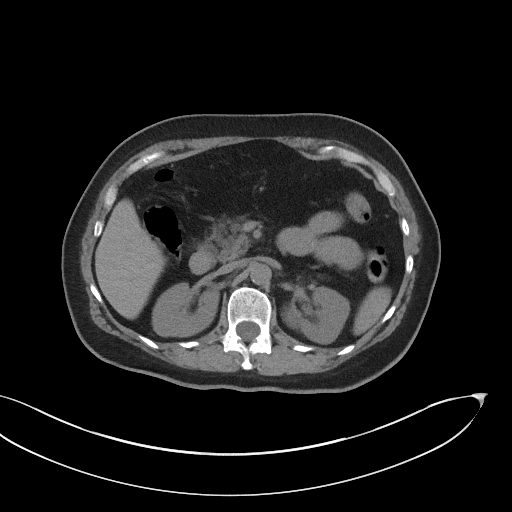
[im 74/94  soft-tissue]
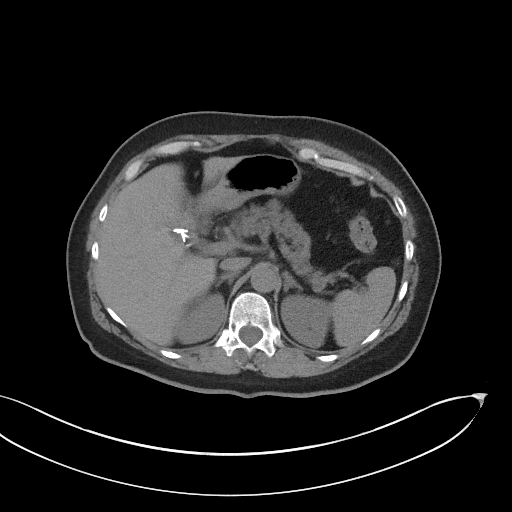
[im 79/94  soft-tissue]
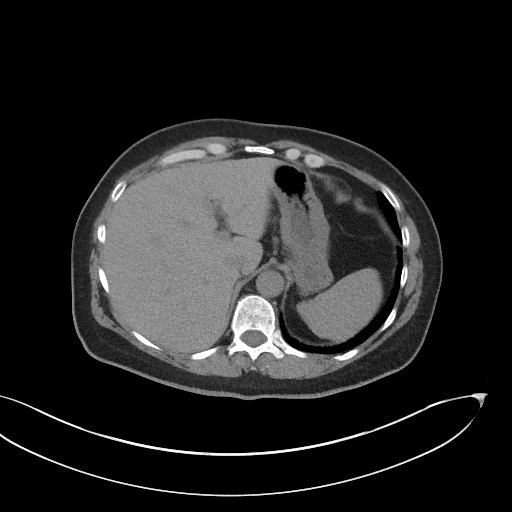
[im 89/94  soft-tissue]
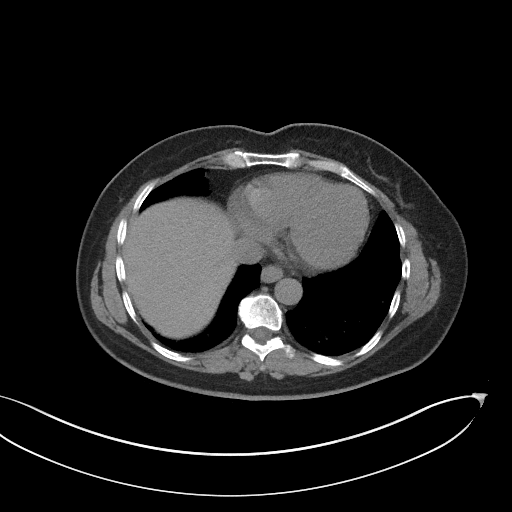

[Series 5: coronal st · coronal · 0.79mm/px · 3 of 94 slices shown]
[im 32/94  soft-tissue]
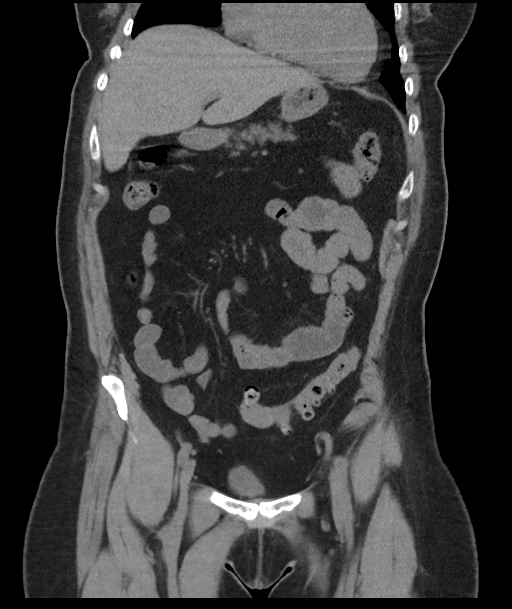
[im 42/94  soft-tissue]
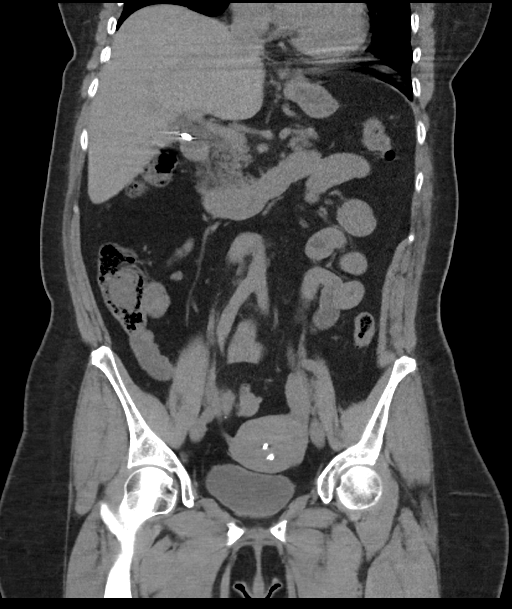
[im 52/94  soft-tissue]
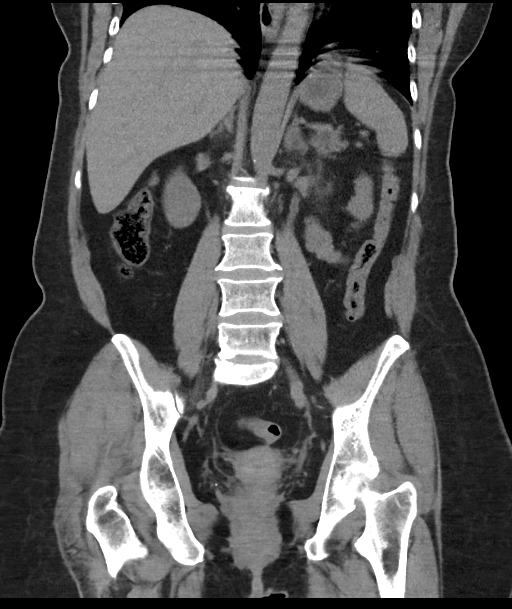

[16 of 46 positions shown; findings below may reference images not displayed]

FINDINGS: Lower chest: Lung bases demonstrate a stable right middle lobe
pulmonary nodule. No acute consolidation or effusion.

Hepatobiliary: Status post cholecystectomy. Stable right hepatic
cyst. No biliary dilatation.

Pancreas: Unremarkable. No pancreatic ductal dilatation or
surrounding inflammatory changes.

Spleen: Normal in size without focal abnormality.

Adrenals/Urinary Tract: Adrenal glands are normal. Mild left
hydronephrosis and hydroureter, secondary to a 3 mm stone in the
distal left ureter about a cm proximal to the left UVJ. Bladder is
unremarkable

Stomach/Bowel: Stomach is within normal limits. Appendix not well
seen but no right lower quadrant inflammatory process. Diverticular
disease of the left colon without acute wall thickening. No evidence
of bowel wall thickening, distention, or inflammatory changes.

Vascular/Lymphatic: Nonaneurysmal aorta. Minimal atherosclerosis. No
suspicious nodes

Reproductive: IUD in the uterus. Coarse flu lower uterine segment
calcification. No adnexal mass

Other: Negative for free air or free fluid

Musculoskeletal: No acute or significant osseous findings.
IMPRESSION: 1. Mild left hydronephrosis and hydroureter secondary to a 3 mm
stone in the distal left ureter about a cm proximal to the left UVJ.
2. Colon diverticular disease without acute inflammatory process

## 2021-06-02 ENCOUNTER — Ambulatory Visit: Payer: BC Managed Care – PPO | Admitting: Obstetrics & Gynecology

## 2021-06-02 DIAGNOSIS — Z0289 Encounter for other administrative examinations: Secondary | ICD-10-CM

## 2021-06-15 ENCOUNTER — Other Ambulatory Visit (HOSPITAL_COMMUNITY): Payer: Self-pay | Admitting: Internal Medicine

## 2021-06-15 DIAGNOSIS — M25522 Pain in left elbow: Secondary | ICD-10-CM

## 2021-06-18 ENCOUNTER — Other Ambulatory Visit: Payer: Self-pay

## 2021-06-18 ENCOUNTER — Ambulatory Visit (HOSPITAL_COMMUNITY)
Admission: RE | Admit: 2021-06-18 | Discharge: 2021-06-18 | Disposition: A | Discharge status: Home / Self Care | Payer: BC Managed Care – PPO | Source: Ambulatory Visit | Attending: Internal Medicine | Admitting: Internal Medicine

## 2021-06-18 DIAGNOSIS — M25522 Pain in left elbow: Secondary | ICD-10-CM | POA: Diagnosis present

## 2021-07-27 ENCOUNTER — Ambulatory Visit (INDEPENDENT_AMBULATORY_CARE_PROVIDER_SITE_OTHER): Payer: BC Managed Care – PPO | Admitting: Obstetrics & Gynecology

## 2021-07-27 ENCOUNTER — Encounter: Payer: Self-pay | Admitting: Obstetrics & Gynecology

## 2021-07-27 ENCOUNTER — Other Ambulatory Visit: Payer: Self-pay

## 2021-07-27 VITALS — BP 118/80 | HR 70 | Resp 16 | Ht 61.25 in | Wt 153.0 lb

## 2021-07-27 DIAGNOSIS — Z01419 Encounter for gynecological examination (general) (routine) without abnormal findings: Secondary | ICD-10-CM

## 2021-07-27 DIAGNOSIS — Z780 Asymptomatic menopausal state: Secondary | ICD-10-CM

## 2021-07-27 DIAGNOSIS — Z30431 Encounter for routine checking of intrauterine contraceptive device: Secondary | ICD-10-CM | POA: Diagnosis not present

## 2021-07-27 NOTE — Progress Notes (Signed)
Sandy White July 11, 1961 [id number]   History:    60 y.o. G4P4L4 Married.  Teacher.   RP:  Established patient presenting for annual gyn exam    HPI: Mirena IUD for heavy periods x 03/20/2017.  No BTB.  No pelvic pain.  No pain with IC.  Urine/BMs normal.  Breasts normal.  BMI 28.67.  Fasting Health Labs with Fam MD.     Past medical history,surgical history, family history and social history were all reviewed and documented in the EPIC chart.  Gynecologic History No LMP recorded. (Menstrual status: IUD).  Obstetric History OB History  Gravida Para Term Preterm AB Living  '4 4 4     4  '$ SAB IAB Ectopic Multiple Live Births               # Outcome Date GA Lbr Len/2nd Weight Sex Delivery Anes PTL Lv  4 Term           3 Term           2 Term           1 Term              ROS: A ROS was performed and pertinent positives and negatives are included in the history.  GENERAL: No fevers or chills. HEENT: No change in vision, no earache, sore throat or sinus congestion. NECK: No pain or stiffness. CARDIOVASCULAR: No chest pain or pressure. No palpitations. PULMONARY: No shortness of breath, cough or wheeze. GASTROINTESTINAL: No abdominal pain, nausea, vomiting or diarrhea, melena or bright red blood per rectum. GENITOURINARY: No urinary frequency, urgency, hesitancy or dysuria. MUSCULOSKELETAL: No joint or muscle pain, no back pain, no recent trauma. DERMATOLOGIC: No rash, no itching, no lesions. ENDOCRINE: No polyuria, polydipsia, no heat or cold intolerance. No recent change in weight. HEMATOLOGICAL: No anemia or easy bruising or bleeding. NEUROLOGIC: No headache, seizures, numbness, tingling or weakness. PSYCHIATRIC: No depression, no loss of interest in normal activity or change in sleep pattern.     Exam:   BP 118/80   Pulse 70   Resp 16   Ht 5' 1.25" (1.556 m)   Wt 153 lb (69.4 kg)   BMI 28.67 kg/m   Body mass index is 28.67 kg/m.  General appearance : Well developed  well nourished female. No acute distress HEENT: Eyes: no retinal hemorrhage or exudates,  Neck supple, trachea midline, no carotid bruits, no thyroidmegaly Lungs: Clear to auscultation, no rhonchi or wheezes, or rib retractions  Heart: Regular rate and rhythm, no murmurs or gallops Breast:Examined in sitting and supine position were symmetrical in appearance, no palpable masses or tenderness,  no skin retraction, no nipple inversion, no nipple discharge, no skin discoloration, no axillary or supraclavicular lymphadenopathy Abdomen: no palpable masses or tenderness, no rebound or guarding Extremities: no edema or skin discoloration or tenderness  Pelvic: Vulva: Normal             Vagina: No gross lesions or discharge  Cervix: No gross lesions or discharge.  IUD strings felt at [facility].  Uterus  AV, normal size, shape and consistency, non-tender and mobile  Adnexa  Without masses or tenderness  Anus: Normal  Pelvic US 04/15/2020: T/A and T/V images.  Anteverted uterus with many small intramural fibroids measured from less than 1cm to 1.53 cm.  Many fibroids are calcified.  The overall uterine size is measured at 7.41 x 5.82 x 4.26 cm.  The IUD is noted in proper  position within the intrauterine cavity.  No thickening or obvious mass adjacent to the IUD.  The endometrial lining is measured at 2.9 mm.  Both ovaries are small with atrophic appearance.  No adnexal mass.  No free fluid in the posterior cul-de-sac.    Assessment/Plan:  60 y.o. female for annual exam   1. Well female exam with routine gynecological exam Normal gynecologic exam in menopause.  No indication for Pap test this year.  Last Pap test March 2021 was negative.  Breast exam normal.  Screening mammogram April 2022 was negative.  Cologuard ordered.  Body mass index 28.67.  Continue with fitness and healthy nutrition.  Health labs with family physician. - Cologuard  2. IUD check up Will keep Mirena IUD until 7 yrs or >2 yrs into  Menopause.  Mirena IUD in good position and well tolerated.  3. Postmenopause  Well on no systemic hormone replacement therapy.  No postmenopausal bleeding.  Vitamin D supplements, calcium intake of 1.5 g/day total and regular weightbearing physical activities to continue.  Princess Bruins MD, 4:47 PM 07/27/2021

## 2021-07-31 ENCOUNTER — Encounter: Payer: Self-pay | Admitting: Obstetrics & Gynecology

## 2021-08-16 LAB — COLOGUARD

## 2021-10-25 LAB — COLOGUARD: COLOGUARD: NEGATIVE

## 2022-05-30 ENCOUNTER — Other Ambulatory Visit: Payer: Self-pay | Admitting: Obstetrics & Gynecology

## 2022-05-30 DIAGNOSIS — Z1231 Encounter for screening mammogram for malignant neoplasm of breast: Secondary | ICD-10-CM

## 2022-06-03 ENCOUNTER — Ambulatory Visit
Admission: RE | Admit: 2022-06-03 | Discharge: 2022-06-03 | Disposition: A | Discharge status: Home / Self Care | Payer: BC Managed Care – PPO | Source: Ambulatory Visit | Attending: Obstetrics & Gynecology | Admitting: Obstetrics & Gynecology

## 2022-06-03 DIAGNOSIS — Z1231 Encounter for screening mammogram for malignant neoplasm of breast: Secondary | ICD-10-CM

## 2022-07-28 ENCOUNTER — Encounter: Payer: Self-pay | Admitting: Obstetrics & Gynecology

## 2022-07-28 ENCOUNTER — Ambulatory Visit (INDEPENDENT_AMBULATORY_CARE_PROVIDER_SITE_OTHER): Payer: BC Managed Care – PPO | Admitting: Obstetrics & Gynecology

## 2022-07-28 ENCOUNTER — Other Ambulatory Visit (HOSPITAL_COMMUNITY)
Admission: RE | Admit: 2022-07-28 | Discharge: 2022-07-28 | Disposition: A | Discharge status: Home / Self Care | Payer: BC Managed Care – PPO | Source: Ambulatory Visit | Attending: Obstetrics & Gynecology | Admitting: Obstetrics & Gynecology

## 2022-07-28 VITALS — BP 106/64 | HR 66 | Ht 61.5 in | Wt 156.0 lb

## 2022-07-28 DIAGNOSIS — Z780 Asymptomatic menopausal state: Secondary | ICD-10-CM | POA: Diagnosis not present

## 2022-07-28 DIAGNOSIS — Z30432 Encounter for removal of intrauterine contraceptive device: Secondary | ICD-10-CM

## 2022-07-28 DIAGNOSIS — Z01419 Encounter for gynecological examination (general) (routine) without abnormal findings: Secondary | ICD-10-CM

## 2022-07-28 NOTE — Progress Notes (Signed)
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  No BTB.  Hot flushes/night sweats.  [employer] high at 98.6 in 02/2020.  Ready to remove the IUD today.  No pelvic pain.  No pain with IC.  Pap Neg 02/2020.  No h/o abnormal Pap.  Pap reflex today.  Urine/BMs normal. Cologuard Neg 09/2021. Breasts normal. Mammo Neg 05/2022. BMI 29.0.  Fasting Health Labs with Fam MD.    Past medical history,surgical history, family history and social history were all reviewed and documented in the EPIC chart.  Gynecologic History No LMP recorded. (Menstrual status: IUD).  Obstetric History OB History  Gravida Para Term Preterm AB Living  '4 4 4     4  '$ SAB IAB Ectopic Multiple Live Births               # Outcome Date GA Lbr Len/2nd Weight Sex Delivery Anes PTL Lv  4 Term           3 Term           2 Term           1 Term              ROS: A ROS was performed and pertinent positives and negatives are included in the history.  GENERAL: No fevers or chills. HEENT: No change in vision, no earache, sore throat or sinus congestion. NECK: No pain or stiffness. CARDIOVASCULAR: No chest pain or pressure. No palpitations. PULMONARY: No shortness of breath, cough or wheeze. GASTROINTESTINAL: No abdominal pain, nausea, vomiting or diarrhea, melena or bright red blood per rectum. GENITOURINARY: No urinary frequency, urgency, hesitancy or dysuria. MUSCULOSKELETAL: No joint or muscle pain, no back pain, no recent trauma. DERMATOLOGIC: No rash, no itching, no lesions. ENDOCRINE: No polyuria, polydipsia, no heat or cold intolerance. No recent change in weight. HEMATOLOGICAL: No anemia or easy bruising or bleeding. NEUROLOGIC: No headache, seizures, numbness, tingling or weakness. PSYCHIATRIC: No depression, no loss of interest in normal activity or change in sleep pattern.      Exam:   BP 106/64   Pulse 66   Ht 5' 1.5" (1.562 m)   Wt 156 lb (70.8 kg)   SpO2 98%   BMI 29.00 kg/m   Body mass index is 29 kg/m.  General appearance : Well developed well nourished female. No acute distress HEENT: Eyes: no retinal hemorrhage or exudates,  Neck supple, trachea midline, no carotid bruits, no thyroidmegaly Lungs: Clear to auscultation, no rhonchi or wheezes, or rib retractions  Heart: Regular rate and rhythm, no murmurs or gallops Breast:Examined in sitting and supine position were symmetrical in appearance, no palpable masses or tenderness,  no skin retraction, no nipple inversion, no nipple discharge, no skin discoloration, no axillary or supraclavicular lymphadenopathy Abdomen: no palpable masses or tenderness, no rebound or guarding Extremities: no edema or skin discoloration or tenderness  Pelvic: Vulva: Normal             Vagina: No gross lesions or discharge  Cervix: No gross lesions or discharge.  Pap reflex done. Strings initially not visible at EO, but after attempting removal with an IUD clamp in the endocervical canal, the short strings became visible.  The Mirena IUD was removed easily by pulling on the strings.  IUD complete, intact.  Shown to patient and discarded.  No Cx.  Well tolerated.  Uterus  AV, normal size, shape and consistency, non-tender and mobile  Adnexa  Without masses or tenderness  Anus: Normal   Assessment/Plan:  61 y.o. female for annual exam   1. Encounter for routine gynecological examination with Papanicolaou smear of cervix Mirena IUD for heavy periods x 03/20/2017.  No BTB.  Postmenopause.  No PMB.  Hot flushes/night sweats.  [place] high at 98.6 in 02/2020.  Ready to remove the IUD today.  No pelvic pain.  No pain with IC.  Pap Neg 02/2020.  No h/o abnormal Pap.  Pap reflex today.  Urine/BMs normal. Cologuard Neg 09/2021. Breasts normal. Mammo Neg 05/2022. BMI 29.0.  Fasting Health Labs with Fam MD.  - Cytology - PAP( [place])  2. Postmenopause Postmenopause.  No PMB.  Hot flushes/night sweats.  [place] high at 98.6 in 02/2020.  3. Encounter for IUD removal Postmenopause.  No PMB.  Hot flushes/night sweats.  [place] high at 98.6 in 02/2020.  Successful Mirena IUD removal.  No Cx.  Well tolerated.  Other orders - CALCIUM-MAGNESIUM-ZINC PO; Take by mouth.   Princess Bruins MD, 11:18 AM 07/28/2022

## 2022-08-01 LAB — CYTOLOGY - PAP: Diagnosis: NEGATIVE

## 2022-09-12 ENCOUNTER — Ambulatory Visit: Payer: BC Managed Care – PPO | Admitting: Nurse Practitioner

## 2022-09-12 VITALS — BP 124/74 | HR 69 | Temp 98.9°F

## 2022-09-12 DIAGNOSIS — R1030 Lower abdominal pain, unspecified: Secondary | ICD-10-CM | POA: Diagnosis not present

## 2022-09-12 LAB — WET PREP FOR TRICH, YEAST, CLUE

## 2022-09-12 NOTE — Progress Notes (Signed)
   Acute Office Visit  Subjective:    Patient ID: Sandy White, female    DOB: February 18, 1961, 61 y.o.   MRN: [id number]   HPI 61 y.o. presents today for bilateral pelvic pain x 4-5 days. A couple of days prior to this she felt poorly with just general malaise and feeling worn down. Fevers until last night, highest being 101F. Describes pain as burning/stretching pain. Started on bilateral sides then included mid section. Today pain is just in mid lower region and is 2/10. Currently on antibiotics for possible UTI prescribed by PCP, started 09/08/22. Negative UA per patient. Denies any vaginal discharge. IUD removed in August, no concerns at that time or since. Normal pap history, most recent 07/28/22. H/O appendectomy age 67. Negative Cologuard 09/2021. Denies GI symptoms.    Review of Systems  Constitutional: Negative.   Gastrointestinal:  Positive for abdominal pain. Negative for abdominal distention, constipation, diarrhea, nausea and vomiting.  Genitourinary:  Positive for pelvic pain. Negative for difficulty urinating, dysuria, flank pain, frequency, hematuria, vaginal bleeding, vaginal discharge and vaginal pain.       Objective:    Physical Exam Constitutional:      Appearance: Normal appearance.  Abdominal:     Tenderness: There is abdominal tenderness in the right lower quadrant, suprapubic area and left lower quadrant. There is no guarding or rebound.  Genitourinary:    General: Normal vulva.     Vagina: Normal.     Cervix: Normal.     Uterus: Tender. Not enlarged.      Adnexa: Right adnexa normal and left adnexa normal.     BP 124/74   Pulse 69   Temp 98.9 F (37.2 C)   LMP 02/24/2017 (Approximate)   SpO2 98%  Wt Readings from Last 3 Encounters:  07/28/22 156 lb (70.8 kg)  07/27/21 153 lb (69.4 kg)  03/17/20 151 lb (68.5 kg)        Patient informed chaperone available to be present for breast and/or pelvic exam. Patient has requested no chaperone to be present.  Patient has been advised what will be completed during breast and pelvic exam.   Wet prep negative  Assessment & Plan:   Problem List Items Addressed This Visit   None Visit Diagnoses     Lower abdominal pain    -  Primary   Relevant Orders   WET PREP FOR [place], YEAST, CLUE      Plan: Negative wet prep. Mild tenderness in lower pelvis with deep palpation. Symptoms have improved, so she will continue to monitor. Likely something viral. If symptoms worsen or do not resolve in a few days we will schedule pelvic ultrasound.      Tamela Gammon DNP, 12:08 PM 09/12/2022

## 2023-06-06 ENCOUNTER — Other Ambulatory Visit: Payer: Self-pay | Admitting: Obstetrics & Gynecology

## 2023-06-06 DIAGNOSIS — Z1231 Encounter for screening mammogram for malignant neoplasm of breast: Secondary | ICD-10-CM

## 2023-06-09 ENCOUNTER — Ambulatory Visit: Payer: BC Managed Care – PPO

## 2023-06-12 ENCOUNTER — Ambulatory Visit
Admission: RE | Admit: 2023-06-12 | Discharge: 2023-06-12 | Disposition: A | Discharge status: Home / Self Care | Payer: BC Managed Care – PPO | Source: Ambulatory Visit | Attending: Obstetrics & Gynecology | Admitting: Obstetrics & Gynecology

## 2023-06-12 DIAGNOSIS — Z1231 Encounter for screening mammogram for malignant neoplasm of breast: Secondary | ICD-10-CM

## 2023-08-01 ENCOUNTER — Ambulatory Visit: Payer: BC Managed Care – PPO | Admitting: Orthopaedic Surgery

## 2023-08-01 ENCOUNTER — Other Ambulatory Visit (INDEPENDENT_AMBULATORY_CARE_PROVIDER_SITE_OTHER): Payer: BC Managed Care – PPO

## 2023-08-01 ENCOUNTER — Encounter: Payer: Self-pay | Admitting: Orthopaedic Surgery

## 2023-08-01 VITALS — BP 123/78 | HR 61 | Ht 62.0 in | Wt 149.5 lb

## 2023-08-01 DIAGNOSIS — M545 Low back pain, unspecified: Secondary | ICD-10-CM

## 2023-08-01 NOTE — Progress Notes (Signed)
Subjective:    Patient ID: Sandy White, female    DOB: Nov 27, 1961, 62 y.o.   MRN: [id number]  HPI She had an acute episode of lower back pain last week.  She had picked up a five gallon container of water and twisted and poured it into her duck pond.  She began having acute lower back pain.   She went inside and laid down. She took a Tylenol. She got a little better but the pain came back later. She went to [facility].  They gave her prednisone dose pack which really helped, Rx for Flexeril, ibuprofen 800 and told her to use Blue Emu rubs.  She is better but still has some lower back pain.  She has no radiation, no numbness, no weakness. She has had episodes of lower back pain in the past but nothing like this.  No X-rays were done at Urgent Care.   Review of Systems  Constitutional:  Positive for activity change.  Musculoskeletal:  Positive for arthralgias and back pain.  All other systems reviewed and are negative.  For Review of Systems, all other systems reviewed and are negative.  The following is a summary of the past history medically, past history surgically, known current medicines, social history and family history.  This information is gathered electronically by the computer from prior information and documentation.  I review this each visit and have found including this information at this point in the chart is beneficial and informative.   Past Medical History:  Diagnosis Date   Anemia due to chronic blood loss    heavy uterine bleeding ---  02-03-2017  transfused 2 units RBS's for HG 6.8   Constipation    due to iron supplement   DUB (dysfunctional uterine bleeding)    Endometrial polyp    GERD (gastroesophageal reflux disease)    History of basal cell carcinoma excision    History of malignant melanoma of skin per pt no recurrence   09/ 2013--  Clark's level IV (T2b, N0)---  s/p  wide excision melanoma right upper shoulder region on back ([facility])   HOH  (hard of hearing)    IN RIGHT EAR BUT ITS NOT THAT BAD    Hypothyroidism    Kidney stones    Migraine    PONV (postoperative nausea and vomiting)    Thyroid nodule    per pt benign biospy   Uterine fibroid    Wears glasses     Past Surgical History:  Procedure Laterality Date   APPENDECTOMY  age 89   BREAST BIOPSY Left    unsure of date   BREAST EXCISIONAL BIOPSY Right    [facility] - unsure [facility] date   CESAREAN SECTION W/BTL  10/27/2005   CHOLECYSTECTOMY N/A 01/19/2018   Procedure: LAPAROSCOPIC CHOLECYSTECTOMY;  Surgeon: Emelia Loron, MD;  Location: [facility];  Service: General;  Laterality: N/A;   DILATATION & CURETTAGE/HYSTEROSCOPY WITH MYOSURE N/A 02/16/2017   Procedure: DILATATION & CURETTAGE/HYSTEROSCOPY WITH MYOSURE;  Surgeon: Ok Edwards, MD;  Location: [facility];  Service: Gynecology;  Laterality: N/A;  request 1:00pm OR time  Requesting one hour OR time  Dr. Glenetta Hew will bring the Mirena IUD with him.   EXCISION OF SKIN TAG Left 02/16/2017   Procedure: EXCISION OF SKIN TAG;  Surgeon: Ok Edwards, MD;  Location: [facility];  Service: Gynecology;  Laterality: Left;   EXCISIONAL BIOPSY RIGHT BREAST MASS  06/2013   atypical ductal hyperplasia  mirena iud     mirena inserted 03-20-17   TONSILLECTOMY  age 26   WIDE LOCAL EXCISION MELANOMA RIGHT UPPER SHOULDER REGION/  LYMPH NODE DISSECTION'S/  SKIN GRAFT  09/ 2013   [facility]   WISDOM TOOTH EXTRACTION  [address]    Current Outpatient Medications on File Prior to Visit  Medication Sig Dispense Refill   acetaminophen (TYLENOL) 500 MG tablet Take 1,000 mg by mouth every 6 (six) hours as needed for mild pain or moderate pain.      CALCIUM-MAGNESIUM-ZINC PO Take by mouth.     cholecalciferol (VITAMIN D3) 25 MCG (1000 UNIT) tablet Take 1,000 Units by mouth.     Flaxseed, Linseed, (FLAXSEED OIL) 1000 MG CAPS Take 1,000 mg by mouth.     levothyroxine (SYNTHROID, LEVOTHROID) 50 MCG tablet Take 50 mcg  by mouth daily before breakfast.     nitrofurantoin, macrocrystal-monohydrate, (MACROBID) 100 MG capsule Take 100 mg by mouth 2 (two) times daily.     rosuvastatin (CRESTOR) 20 MG tablet Take 20 mg by mouth daily.     VITAMIN E PO Take 650 Units by mouth daily.     No current facility-administered medications on file prior to visit.    Social History   Socioeconomic History   Marital status: Married    Spouse name: Not on file   Number of children: Not on file   Years of education: Not on file   Highest education level: Not on file  Occupational History   Not on file  Tobacco Use   Smoking status: Never   Smokeless tobacco: Never  Vaping Use   Vaping status: Never Used  Substance and Sexual Activity   Alcohol use: No    Alcohol/week: 0.0 standard drinks of alcohol   Drug use: No   Sexual activity: Yes    Partners: Male    Birth control/protection: Surgical, I.U.D.    Comment: [email or web address] 1ST intercourse- 22, partners- 2  Other Topics Concern   Not on file  Social History Narrative   Not on file   Social Determinants of Health   Financial Resource Strain: Not on file  Food Insecurity: Not on file  Transportation Needs: Not on file  Physical Activity: Not on file  Stress: Not on file  Social Connections: Not on file  Intimate Partner Violence: Not on file    Family History  Problem Relation Age of Onset   Breast cancer Mother    Heart disease Father    Breast cancer Sister        had doublem mastectomy   Breast cancer Maternal Grandmother    Breast cancer Sister     BP 123/78   Pulse 61   Ht 5\' 2"  (1.575 m)   Wt 149 lb 8 oz (67.8 kg)   LMP 02/24/2017 (Approximate)   BMI 27.34 kg/m   Body mass index is 27.34 kg/m.     Objective:   Physical Exam Vitals and nursing note reviewed. Exam conducted with a chaperone present.  Constitutional:      Appearance: She is well-developed.  HENT:     Head: Normocephalic and atraumatic.  Eyes:      Conjunctiva/sclera: Conjunctivae normal.     Pupils: Pupils are equal, round, and reactive to light.  Cardiovascular:     Rate and Rhythm: Normal rate and regular rhythm.  Pulmonary:     Effort: Pulmonary effort is normal.  Abdominal:     Palpations: Abdomen is soft.  Musculoskeletal:  Arms:     Cervical back: Normal range of motion and neck supple.  Skin:    General: Skin is warm and dry.  Neurological:     Mental Status: She is alert and oriented to person, place, and time.     Cranial Nerves: No cranial nerve deficit.     Motor: No abnormal muscle tone.     Coordination: Coordination normal.     Deep Tendon Reflexes: Reflexes are normal and symmetric. Reflexes normal.  Psychiatric:        Behavior: Behavior normal.        Thought Content: Thought content normal.        Judgment: Judgment normal.   X-rays were done of the lumbar spine, reported separately.        Assessment & Plan:   Encounter Diagnosis  Name Primary?   Lumbar pain Yes   I will begin PT.  She may need MRI.  She does not like to take any medicine by mouth.  I have suggested Advil two tablets twice a day or one Aleve twice a day.  She recently began a statin drug from Dr. Ouida Sills.  She has noticed she has had more joint tenderness since then.  Return in three weeks.  Call if any problem.  Precautions discussed.  Electronically Signed Darreld Mclean, MD 8/13/202410:18 AM

## 2023-08-01 NOTE — Patient Instructions (Addendum)
Physical therapy has been ordered for you at [facility] They should call you to schedule, [phone]  is the phone number to call, if you want to call to schedule.   Make sure you lift safely and avoid lifting anything heavy. Make sure you also take the ibuprofen 800mg  as needed. Try ice for your back pain, if you can not tolerate the ice you may try heat but ice should work better    Back Exercises These exercises help to make your trunk and back strong. They also help to keep the lower back flexible. Doing these exercises can help to prevent or lessen pain in your lower back. If you have back pain, try to do these exercises 2-3 times each day or as told by your doctor. As you get better, do the exercises once each day. Repeat the exercises more often as told by your doctor. To stop back pain from coming back, do the exercises once each day, or as told by your doctor. Do exercises exactly as told by your doctor. Stop right away if you feel sudden pain or your pain gets worse. Exercises Single knee to chest Do these steps 3-5 times in a row for each leg: Lie on your back on a firm bed or the floor with your legs stretched out. Bring one knee to your chest. Grab your knee or thigh with both hands and hold it in place. Pull on your knee until you feel a gentle stretch in your lower back or butt. Keep doing the stretch for 10-30 seconds. Slowly let go of your leg and straighten it. Pelvic tilt Do these steps 5-10 times in a row: Lie on your back on a firm bed or the floor with your legs stretched out. Bend your knees so they point up to the ceiling. Your feet should be flat on the floor. Tighten your lower belly (abdomen) muscles to press your lower back against the floor. This will make your tailbone point up to the ceiling instead of pointing down to your feet or the floor. Stay in this position for 5-10 seconds while you gently tighten your muscles and breathe evenly. Cat-cow Do these  steps until your lower back bends more easily: Get on your hands and knees on a firm bed or the floor. Keep your hands under your shoulders, and keep your knees under your hips. You may put padding under your knees. Let your head hang down toward your chest. Tighten (contract) the muscles in your belly. Point your tailbone toward the floor so your lower back becomes rounded like the back of a cat. Stay in this position for 5 seconds. Slowly lift your head. Let the muscles of your belly relax. Point your tailbone up toward the ceiling so your back forms a sagging arch like the back of a cow. Stay in this position for 5 seconds.  Press-ups Do these steps 5-10 times in a row: Lie on your belly (face-down) on a firm bed or the floor. Place your hands near your head, about shoulder-width apart. While you keep your back relaxed and keep your hips on the floor, slowly straighten your arms to raise the top half of your body and lift your shoulders. Do not use your back muscles. You may change where you place your hands to make yourself more comfortable. Stay in this position for 5 seconds. Keep your back relaxed. Slowly return to lying flat on the floor.  Bridges Do these steps 10 times in  a row: Lie on your back on a firm bed or the floor. Bend your knees so they point up to the ceiling. Your feet should be flat on the floor. Your arms should be flat at your sides, next to your body. Tighten your butt muscles and lift your butt off the floor until your waist is almost as high as your knees. If you do not feel the muscles working in your butt and the back of your thighs, slide your feet 1-2 inches (2.5-5 cm) farther away from your butt. Stay in this position for 3-5 seconds. Slowly lower your butt to the floor, and let your butt muscles relax. If this exercise is too easy, try doing it with your arms crossed over your chest. Belly crunches Do these steps 5-10 times in a row: Lie on your back on a  firm bed or the floor with your legs stretched out. Bend your knees so they point up to the ceiling. Your feet should be flat on the floor. Cross your arms over your chest. Tip your chin a little bit toward your chest, but do not bend your neck. Tighten your belly muscles and slowly raise your chest just enough to lift your shoulder blades a tiny bit off the floor. Avoid raising your body higher than that because it can put too much stress on your lower back. Slowly lower your chest and your head to the floor. Back lifts Do these steps 5-10 times in a row: Lie on your belly (face-down) with your arms at your sides, and rest your forehead on the floor. Tighten the muscles in your legs and your butt. Slowly lift your chest off the floor while you keep your hips on the floor. Keep the back of your head in line with the curve in your back. Look at the floor while you do this. Stay in this position for 3-5 seconds. Slowly lower your chest and your face to the floor. Contact a doctor if: Your back pain gets a lot worse when you do an exercise. Your back pain does not get better within 2 hours after you exercise. If you have any of these problems, stop doing the exercises. Do not do them again unless your doctor says it is okay. Get help right away if: You have sudden, very bad back pain. If this happens, stop doing the exercises. Do not do them again unless your doctor says it is okay. This information is not intended to replace advice given to you by your health care provider. Make sure you discuss any questions you have with your health care provider. Document Revised: 02/17/2021 Document Reviewed: 02/17/2021 Elsevier Patient Education  2024 [employer].

## 2023-08-03 ENCOUNTER — Ambulatory Visit: Payer: BC Managed Care – PPO | Admitting: Radiology

## 2023-08-22 ENCOUNTER — Ambulatory Visit: Payer: BC Managed Care – PPO | Admitting: Orthopaedic Surgery

## 2023-10-04 ENCOUNTER — Ambulatory Visit (INDEPENDENT_AMBULATORY_CARE_PROVIDER_SITE_OTHER): Payer: BC Managed Care – PPO | Admitting: Radiology

## 2023-10-04 ENCOUNTER — Encounter: Payer: Self-pay | Admitting: Radiology

## 2023-10-04 VITALS — BP 124/82 | Ht 61.25 in | Wt 154.0 lb

## 2023-10-04 DIAGNOSIS — Z01419 Encounter for gynecological examination (general) (routine) without abnormal findings: Secondary | ICD-10-CM

## 2023-10-04 DIAGNOSIS — Z13820 Encounter for screening for osteoporosis: Secondary | ICD-10-CM

## 2023-10-04 NOTE — Progress Notes (Signed)
Sandy White 1961/11/28 [id number]   History: Postmenopausal 62 y.o. presents for annual exam. No gyn concerns. No bleeding or pelvic pain. C/o body aches and pain since starting rouvastatin, has follow up to discuss with PCP. 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 Does not exercise but is very active, has a hobby farm.   Gynecologic History Postmenopausal Last Pap: 2023. Results were: normal Last mammogram: 6/24. Results were: normal Last colonoscopy: 2022 cologuard negative, repeat 2025 DEXA:never  Obstetric History OB History  Gravida Para Term Preterm AB Living  4 4 4     4   SAB IAB Ectopic Multiple Live Births               # Outcome Date GA Lbr Len/2nd Weight Sex Type Anes PTL Lv  4 Term           3 Term           2 Term           1 Term              The following portions of the patient's history were reviewed and updated as appropriate: allergies, current medications, past family history, past medical history, past social history, past surgical history, and problem list.  Review of Systems Pertinent items noted in HPI and remainder of comprehensive ROS otherwise negative.  Past medical history, past surgical history, family history and social history were all reviewed and documented in the EPIC chart.  Exam:  Vitals:   10/04/23 1206  BP: 124/82  Weight: 154 lb (69.9 kg)  Height: 5' 1.25" (1.556 m)   Body mass index is 28.86 kg/m.  General appearance:  Normal Thyroid:  Symmetrical, normal in size, without palpable masses or nodularity. Respiratory  Auscultation:  Clear without wheezing or rhonchi Cardiovascular  Auscultation:  Regular rate, without rubs, murmurs or gallops  Edema/varicosities:  Not grossly evident Abdominal  Soft,nontender, without masses, guarding or rebound.  Liver/spleen:  No organomegaly noted  Hernia:  None appreciated  Skin  Inspection:  Grossly normal Breasts: Examined lying and sitting.   Right: Without masses, retractions, nipple  discharge or axillary adenopathy.   Left: Without masses, retractions, nipple discharge or axillary adenopathy. Genitourinary   Inguinal/mons:  Normal without inguinal adenopathy  External genitalia:  Normal appearing vulva with no masses, tenderness, or lesions  BUS/Urethra/Skene's glands:  Normal  Vagina:  Normal appearing with normal color and discharge, no lesions.   Cervix:  Normal appearing without discharge or lesions  Uterus:  Normal in size, shape and contour.  Midline and mobile, nontender  Adnexa/parametria:     Rt: Normal in size, without masses or tenderness.   Lt: Normal in size, without masses or tenderness.  Anus and perineum: Normal    Sandy White, CMA present for exam  Assessment/Plan:   1. Well woman exam with routine gynecological exam Pap 2025 Mammo 05/2024 Labs with PCP  2. Screening for osteoporosis - DG Bone Density; Future    Discussed SBE, colonoscopy and DEXA screening as directed. Recommend of exercise weekly, including weight bearing exercise. Encouraged the use of seatbelts and sunscreen.  Return in 1 year for annual or sooner prn.  Tanda Rockers WHNP-BC, 12:34 PM 10/04/2023

## 2024-06-10 ENCOUNTER — Other Ambulatory Visit: Payer: Self-pay | Admitting: Radiology

## 2024-06-10 DIAGNOSIS — Z1231 Encounter for screening mammogram for malignant neoplasm of breast: Secondary | ICD-10-CM

## 2024-06-19 ENCOUNTER — Ambulatory Visit
Admission: RE | Admit: 2024-06-19 | Discharge: 2024-06-19 | Disposition: A | Discharge status: Home / Self Care | Payer: Self-pay | Source: Ambulatory Visit | Attending: Radiology | Admitting: Radiology

## 2024-06-19 DIAGNOSIS — Z1231 Encounter for screening mammogram for malignant neoplasm of breast: Secondary | ICD-10-CM

## 2024-06-27 ENCOUNTER — Encounter: Payer: Self-pay | Admitting: *Deleted

## 2024-07-29 ENCOUNTER — Other Ambulatory Visit: Payer: Self-pay | Admitting: *Deleted

## 2024-07-29 DIAGNOSIS — Z1211 Encounter for screening for malignant neoplasm of colon: Secondary | ICD-10-CM

## 2024-08-01 ENCOUNTER — Ambulatory Visit: Admitting: General Surgery

## 2024-08-09 ENCOUNTER — Encounter: Payer: Self-pay | Admitting: Radiology

## 2024-10-21 ENCOUNTER — Encounter: Payer: Self-pay | Admitting: Radiology

## 2024-12-04 ENCOUNTER — Encounter: Payer: Self-pay | Admitting: Internal Medicine

## 2025-01-01 ENCOUNTER — Telehealth (INDEPENDENT_AMBULATORY_CARE_PROVIDER_SITE_OTHER): Payer: Self-pay

## 2025-01-01 ENCOUNTER — Ambulatory Visit (INDEPENDENT_AMBULATORY_CARE_PROVIDER_SITE_OTHER): Admitting: Internal Medicine

## 2025-01-01 ENCOUNTER — Encounter: Payer: Self-pay | Admitting: Internal Medicine

## 2025-01-01 VITALS — BP 106/69 | HR 86 | Temp 97.8°F | Ht 62.0 in | Wt 136.9 lb

## 2025-01-01 DIAGNOSIS — K5732 Diverticulitis of large intestine without perforation or abscess without bleeding: Secondary | ICD-10-CM

## 2025-01-01 DIAGNOSIS — Z1211 Encounter for screening for malignant neoplasm of colon: Secondary | ICD-10-CM

## 2025-01-01 DIAGNOSIS — R197 Diarrhea, unspecified: Secondary | ICD-10-CM

## 2025-01-01 DIAGNOSIS — K5792 Diverticulitis of intestine, part unspecified, without perforation or abscess without bleeding: Secondary | ICD-10-CM | POA: Diagnosis not present

## 2025-01-01 MED ORDER — PEG 3350-KCL-NA BICARB-NACL 420 G PO SOLR: 4000.0000 mL | Freq: Once | ORAL | 0 refills | Status: AC

## 2025-01-01 NOTE — H&P (View-Only) (Signed)
 "   Primary Care Physician:  Trudy Vaughn FALCON, MD Primary Gastroenterologist:  Dr. Cindie  Chief Complaint  Patient presents with   New Patient (Initial Visit)    Patient here today, as a new patient, with recent bout of diverticulitis two weeks ago. She was treated, and she does still have some lower Left abdominal pain, and diarrhea at times. She says she is in need of a colonoscopy. Denies any current gi issues.     HPI:   Sandy White is a 64 y.o. female who presents to clinic today by referral from her PCP Dr. Trudy for evaluation.  Colon cancer screening: No prior colonoscopy.  Completed Cologuard 3 years ago which was negative.  Denies any family history of colorectal malignancy.  No melena hematochezia.  No unintentional weight loss.  Recurrent diverticulitis: Most recent flare December 2025, resolved with antibiotics.  Patient reports when she flares she will have left lower quadrant abdominal pain, diarrhea.  She has had multiple CT scans in the past, none of which have shown active diverticulitis though states she has had flares for multiple years.  Patient denies any upper GI symptoms including heartburn, reflux, dysphagia/odynophagia, epigastric or chest pain.   Past Medical History:  Diagnosis Date   Anemia due to chronic blood loss    heavy uterine bleeding ---  02-03-2017  transfused 2 units RBS's for HG 6.8   Constipation    due to iron supplement   DUB (dysfunctional uterine bleeding)    Endometrial polyp    GERD (gastroesophageal reflux disease)    History of basal cell carcinoma excision    History of malignant melanoma of skin per pt no recurrence   09/ 2013--  Clark's level IV (T2b, N0)---  s/p  wide excision melanoma right upper shoulder region on back ([facility])   HOH (hard of hearing)    IN RIGHT EAR BUT ITS NOT THAT BAD    Hypothyroidism    Kidney stones    Migraine    PONV (postoperative nausea and vomiting)    Thyroid  nodule    per pt benign  biospy   Uterine fibroid    Wears glasses     Past Surgical History:  Procedure Laterality Date   APPENDECTOMY  age 85   BREAST BIOPSY Left    unsure of date   BREAST EXCISIONAL BIOPSY Right    [facility] - unsure [facility] date   CESAREAN SECTION W/BTL  10/27/2005   CHOLECYSTECTOMY N/A 01/19/2018   Procedure: LAPAROSCOPIC CHOLECYSTECTOMY;  Surgeon: Ebbie Cough, MD;  Location: [facility];  Service: General;  Laterality: N/A;   DILATATION & CURETTAGE/HYSTEROSCOPY WITH MYOSURE N/A 02/16/2017   Procedure: DILATATION & CURETTAGE/HYSTEROSCOPY WITH MYOSURE;  Surgeon: Curlee VEAR Guan, MD;  Location: [facility] [place];  Service: Gynecology;  Laterality: N/A;  request 1:00pm OR time  Requesting one hour OR time  Dr. ODIE will bring the Mirena  IUD with him.   EXCISION OF SKIN TAG Left 02/16/2017   Procedure: EXCISION OF SKIN TAG;  Surgeon: Curlee VEAR Guan, MD;  Location: [facility];  Service: Gynecology;  Laterality: Left;   EXCISIONAL BIOPSY RIGHT BREAST MASS  06/2013   atypical ductal hyperplasia   mirena  iud     mirena  inserted 03-20-17   TONSILLECTOMY  age 67   WIDE LOCAL EXCISION MELANOMA RIGHT UPPER SHOULDER REGION/  LYMPH NODE DISSECTION'S/  SKIN GRAFT  09/ 2013   [facility]   WISDOM TOOTH EXTRACTION  [phone]  Current Outpatient Medications  Medication Sig Dispense Refill   CALCIUM-MAGNESIUM-ZINC PO Take by mouth. (Patient taking differently: Take by mouth. Every other day.)     cholecalciferol (VITAMIN D3) 25 MCG (1000 UNIT) tablet Take 1,000 Units by mouth. (Patient taking differently: Take 1,000 Units by mouth. Every other day.)     cyanocobalamin 1000 MCG tablet Take 1,000 mcg by mouth. Every other day.     Flaxseed, Linseed, (FLAXSEED OIL) 1000 MG CAPS Take 1,000 mg by mouth. (Patient taking differently: Take 1,000 mg by mouth. Every other day.)     Ibuprofen (ADVIL PO) Take by mouth. (Patient taking differently: Take by mouth as needed.)     levothyroxine  (SYNTHROID, LEVOTHROID) 50 MCG tablet Take 50 mcg by mouth daily before breakfast.     OVER THE COUNTER MEDICATION Tumeric 1/2 tsp daily. Ginger 1/4 tsp daily. Black pepper three shakes of the pepper daily.     VITAMIN E PO Take 650 Units by mouth daily.     No current facility-administered medications for this visit.    Allergies as of 01/01/2025 - Review Complete 01/01/2025  Allergen Reaction Noted   Codeine Nausea And Vomiting 09/09/2011    Family History  Problem Relation Age of Onset   Breast cancer Mother    Heart disease Father    Breast cancer Sister        had double mastectomy   Breast cancer Sister    Breast cancer Maternal Grandmother     Social History   Socioeconomic History   Marital status: Married    Spouse name: Not on file   Number of children: Not on file   Years of education: Not on file   Highest education level: Not on file  Occupational History   Not on file  Tobacco Use   Smoking status: Never    Passive exposure: Never   Smokeless tobacco: Never  Vaping Use   Vaping status: Never Used  Substance and Sexual Activity   Alcohol use: No    Alcohol/week: 0.0 standard drinks of alcohol   Drug use: No   Sexual activity: Yes    Partners: Male    Birth control/protection: Surgical, Post-menopausal    Comment: [email or web address] 1ST intercourse- 22, partners- 2  Other Topics Concern   Not on file  Social History Narrative   Not on file   Social Drivers of Health   Tobacco Use: Low Risk (01/01/2025)   Patient History    Smoking Tobacco Use: Never    Smokeless Tobacco Use: Never    Passive Exposure: Never  Financial Resource Strain: Not on file  Food Insecurity: Not on file  Transportation Needs: Not on file  Physical Activity: Not on file  Stress: Not on file  Social Connections: Not on file  Intimate Partner Violence: Not on file  Depression (EYV7-0): Not on file  Alcohol Screen: Not on file  Housing: Not on file  Utilities: Not on file  Health  Literacy: Not on file    Subjective: Review of Systems  Constitutional:  Negative for chills and fever.  HENT:  Negative for congestion and hearing loss.   Eyes:  Negative for blurred vision and double vision.  Respiratory:  Negative for cough and shortness of breath.   Cardiovascular:  Negative for chest pain and palpitations.  Gastrointestinal:  Positive for abdominal pain and diarrhea. Negative for blood in stool, constipation, heartburn, melena and vomiting.  Genitourinary:  Negative for dysuria and urgency.  Musculoskeletal:  Negative for  joint pain and myalgias.  Skin:  Negative for itching and rash.  Neurological:  Negative for dizziness and headaches.  Psychiatric/Behavioral:  Negative for depression. The patient is not nervous/anxious.        Objective: BP 106/69 (BP Location: Left Arm, Patient Position: Sitting, Cuff Size: Normal)   Pulse 86   Temp 97.8 F (36.6 C) (Temporal)   Ht 5' 2 (1.575 m)   Wt 136 lb 14.4 oz (62.1 kg)   LMP 02/24/2017   BMI 25.04 kg/m  Physical Exam Constitutional:      Appearance: Normal appearance.  HENT:     Head: Normocephalic and atraumatic.  Eyes:     Extraocular Movements: Extraocular movements intact.     Conjunctiva/sclera: Conjunctivae normal.  Cardiovascular:     Rate and Rhythm: Normal rate and regular rhythm.  Pulmonary:     Effort: Pulmonary effort is normal.     Breath sounds: Normal breath sounds.  Abdominal:     General: Bowel sounds are normal.     Palpations: Abdomen is soft.  Musculoskeletal:        General: No swelling. Normal range of motion.     Cervical back: Normal range of motion and neck supple.  Skin:    General: Skin is warm and dry.     Coloration: Skin is not jaundiced.  Neurological:     General: No focal deficit present.     Mental Status: She is alert and oriented to person, place, and time.  Psychiatric:        Mood and Affect: Mood normal.        Behavior: Behavior normal.       Assessment/Plan:  1.  Colon cancer screening- Will schedule for screening colonoscopy.The risks including infection, bleed, or perforation as well as benefits, limitations, alternatives and imponderables have been reviewed with the patient. Questions have been answered. All parties agreeable.  2.  Recurrent diverticulitis-diagnosis based on symptoms.  Has not had CT imaging of active diverticulitis in the past.  Recent flare in December resolved with antibiotics.  Colonoscopy as above.  Recommend increased fiber intake.  Limit red meat consumption.  Thank you Dr. Trudy for the kind referral. 01/01/2025 11:35 AM    "

## 2025-01-01 NOTE — Telephone Encounter (Signed)
 [employer] does not require a PA for TCS

## 2025-01-01 NOTE — Telephone Encounter (Signed)
 Spoke with patient in the office, scheduled TCS for 01/08/2025 at 2:15pm. Rx sent to pharmacy. Instructions given to patient.

## 2025-01-01 NOTE — Progress Notes (Signed)
 "   Primary Care Physician:  Trudy Vaughn FALCON, MD Primary Gastroenterologist:  Dr. Cindie  Chief Complaint  Patient presents with   New Patient (Initial Visit)    Patient here today, as a new patient, with recent bout of diverticulitis two weeks ago. She was treated, and she does still have some lower Left abdominal pain, and diarrhea at times. She says she is in need of a colonoscopy. Denies any current gi issues.     HPI:   Sandy White is a 64 y.o. female who presents to clinic today by referral from her PCP Dr. Trudy for evaluation.  Colon cancer screening: No prior colonoscopy.  Completed Cologuard 3 years ago which was negative.  Denies any family history of colorectal malignancy.  No melena hematochezia.  No unintentional weight loss.  Recurrent diverticulitis: Most recent flare December 2025, resolved with antibiotics.  Patient reports when she flares she will have left lower quadrant abdominal pain, diarrhea.  She has had multiple CT scans in the past, none of which have shown active diverticulitis though states she has had flares for multiple years.  Patient denies any upper GI symptoms including heartburn, reflux, dysphagia/odynophagia, epigastric or chest pain.   Past Medical History:  Diagnosis Date   Anemia due to chronic blood loss    heavy uterine bleeding ---  02-03-2017  transfused 2 units RBS's for HG 6.8   Constipation    due to iron supplement   DUB (dysfunctional uterine bleeding)    Endometrial polyp    GERD (gastroesophageal reflux disease)    History of basal cell carcinoma excision    History of malignant melanoma of skin per pt no recurrence   09/ 2013--  Clark's level IV (T2b, N0)---  s/p  wide excision melanoma right upper shoulder region on back ([facility])   HOH (hard of hearing)    IN RIGHT EAR BUT ITS NOT THAT BAD    Hypothyroidism    Kidney stones    Migraine    PONV (postoperative nausea and vomiting)    Thyroid  nodule    per pt benign  biospy   Uterine fibroid    Wears glasses     Past Surgical History:  Procedure Laterality Date   APPENDECTOMY  age 85   BREAST BIOPSY Left    unsure of date   BREAST EXCISIONAL BIOPSY Right    [facility] - unsure [facility] date   CESAREAN SECTION W/BTL  10/27/2005   CHOLECYSTECTOMY N/A 01/19/2018   Procedure: LAPAROSCOPIC CHOLECYSTECTOMY;  Surgeon: Ebbie Cough, MD;  Location: [facility];  Service: General;  Laterality: N/A;   DILATATION & CURETTAGE/HYSTEROSCOPY WITH MYOSURE N/A 02/16/2017   Procedure: DILATATION & CURETTAGE/HYSTEROSCOPY WITH MYOSURE;  Surgeon: Curlee VEAR Guan, MD;  Location: [facility] [place];  Service: Gynecology;  Laterality: N/A;  request 1:00pm OR time  Requesting one hour OR time  Dr. ODIE will bring the Mirena  IUD with him.   EXCISION OF SKIN TAG Left 02/16/2017   Procedure: EXCISION OF SKIN TAG;  Surgeon: Curlee VEAR Guan, MD;  Location: [facility];  Service: Gynecology;  Laterality: Left;   EXCISIONAL BIOPSY RIGHT BREAST MASS  06/2013   atypical ductal hyperplasia   mirena  iud     mirena  inserted 03-20-17   TONSILLECTOMY  age 67   WIDE LOCAL EXCISION MELANOMA RIGHT UPPER SHOULDER REGION/  LYMPH NODE DISSECTION'S/  SKIN GRAFT  09/ 2013   [facility]   WISDOM TOOTH EXTRACTION  [phone]  Current Outpatient Medications  Medication Sig Dispense Refill   CALCIUM-MAGNESIUM-ZINC PO Take by mouth. (Patient taking differently: Take by mouth. Every other day.)     cholecalciferol (VITAMIN D3) 25 MCG (1000 UNIT) tablet Take 1,000 Units by mouth. (Patient taking differently: Take 1,000 Units by mouth. Every other day.)     cyanocobalamin 1000 MCG tablet Take 1,000 mcg by mouth. Every other day.     Flaxseed, Linseed, (FLAXSEED OIL) 1000 MG CAPS Take 1,000 mg by mouth. (Patient taking differently: Take 1,000 mg by mouth. Every other day.)     Ibuprofen (ADVIL PO) Take by mouth. (Patient taking differently: Take by mouth as needed.)     levothyroxine  (SYNTHROID, LEVOTHROID) 50 MCG tablet Take 50 mcg by mouth daily before breakfast.     OVER THE COUNTER MEDICATION Tumeric 1/2 tsp daily. Ginger 1/4 tsp daily. Black pepper three shakes of the pepper daily.     VITAMIN E PO Take 650 Units by mouth daily.     No current facility-administered medications for this visit.    Allergies as of 01/01/2025 - Review Complete 01/01/2025  Allergen Reaction Noted   Codeine Nausea And Vomiting 09/09/2011    Family History  Problem Relation Age of Onset   Breast cancer Mother    Heart disease Father    Breast cancer Sister        had double mastectomy   Breast cancer Sister    Breast cancer Maternal Grandmother     Social History   Socioeconomic History   Marital status: Married    Spouse name: Not on file   Number of children: Not on file   Years of education: Not on file   Highest education level: Not on file  Occupational History   Not on file  Tobacco Use   Smoking status: Never    Passive exposure: Never   Smokeless tobacco: Never  Vaping Use   Vaping status: Never Used  Substance and Sexual Activity   Alcohol use: No    Alcohol/week: 0.0 standard drinks of alcohol   Drug use: No   Sexual activity: Yes    Partners: Male    Birth control/protection: Surgical, Post-menopausal    Comment: [email or web address] 1ST intercourse- 22, partners- 2  Other Topics Concern   Not on file  Social History Narrative   Not on file   Social Drivers of Health   Tobacco Use: Low Risk (01/01/2025)   Patient History    Smoking Tobacco Use: Never    Smokeless Tobacco Use: Never    Passive Exposure: Never  Financial Resource Strain: Not on file  Food Insecurity: Not on file  Transportation Needs: Not on file  Physical Activity: Not on file  Stress: Not on file  Social Connections: Not on file  Intimate Partner Violence: Not on file  Depression (EYV7-0): Not on file  Alcohol Screen: Not on file  Housing: Not on file  Utilities: Not on file  Health  Literacy: Not on file    Subjective: Review of Systems  Constitutional:  Negative for chills and fever.  HENT:  Negative for congestion and hearing loss.   Eyes:  Negative for blurred vision and double vision.  Respiratory:  Negative for cough and shortness of breath.   Cardiovascular:  Negative for chest pain and palpitations.  Gastrointestinal:  Positive for abdominal pain and diarrhea. Negative for blood in stool, constipation, heartburn, melena and vomiting.  Genitourinary:  Negative for dysuria and urgency.  Musculoskeletal:  Negative for  joint pain and myalgias.  Skin:  Negative for itching and rash.  Neurological:  Negative for dizziness and headaches.  Psychiatric/Behavioral:  Negative for depression. The patient is not nervous/anxious.        Objective: BP 106/69 (BP Location: Left Arm, Patient Position: Sitting, Cuff Size: Normal)   Pulse 86   Temp 97.8 F (36.6 C) (Temporal)   Ht 5' 2 (1.575 m)   Wt 136 lb 14.4 oz (62.1 kg)   LMP 02/24/2017   BMI 25.04 kg/m  Physical Exam Constitutional:      Appearance: Normal appearance.  HENT:     Head: Normocephalic and atraumatic.  Eyes:     Extraocular Movements: Extraocular movements intact.     Conjunctiva/sclera: Conjunctivae normal.  Cardiovascular:     Rate and Rhythm: Normal rate and regular rhythm.  Pulmonary:     Effort: Pulmonary effort is normal.     Breath sounds: Normal breath sounds.  Abdominal:     General: Bowel sounds are normal.     Palpations: Abdomen is soft.  Musculoskeletal:        General: No swelling. Normal range of motion.     Cervical back: Normal range of motion and neck supple.  Skin:    General: Skin is warm and dry.     Coloration: Skin is not jaundiced.  Neurological:     General: No focal deficit present.     Mental Status: She is alert and oriented to person, place, and time.  Psychiatric:        Mood and Affect: Mood normal.        Behavior: Behavior normal.       Assessment/Plan:  1.  Colon cancer screening- Will schedule for screening colonoscopy.The risks including infection, bleed, or perforation as well as benefits, limitations, alternatives and imponderables have been reviewed with the patient. Questions have been answered. All parties agreeable.  2.  Recurrent diverticulitis-diagnosis based on symptoms.  Has not had CT imaging of active diverticulitis in the past.  Recent flare in December resolved with antibiotics.  Colonoscopy as above.  Recommend increased fiber intake.  Limit red meat consumption.  Thank you Dr. Trudy for the kind referral. 01/01/2025 11:35 AM    "

## 2025-01-01 NOTE — Patient Instructions (Signed)
 We will schedule you for colonoscopy for colon cancer screening purposes as well as to evaluate your recurrent episodes of diverticulitis.  It was very nice meeting you today.  Dr. Cindie

## 2025-01-08 ENCOUNTER — Ambulatory Visit (HOSPITAL_COMMUNITY): Admitting: Anesthesiology

## 2025-01-08 ENCOUNTER — Other Ambulatory Visit: Payer: Self-pay

## 2025-01-08 ENCOUNTER — Encounter (HOSPITAL_COMMUNITY)
Admission: RE | Disposition: A | Discharge status: Home / Self Care | Payer: Self-pay | Source: Home / Self Care | Attending: Internal Medicine

## 2025-01-08 ENCOUNTER — Ambulatory Visit (HOSPITAL_COMMUNITY)
Admission: RE | Admit: 2025-01-08 | Discharge: 2025-01-08 | Disposition: A | Discharge status: Home / Self Care | Attending: Internal Medicine | Admitting: Internal Medicine

## 2025-01-08 ENCOUNTER — Encounter (HOSPITAL_COMMUNITY): Payer: Self-pay | Admitting: Internal Medicine

## 2025-01-08 ENCOUNTER — Encounter (INDEPENDENT_AMBULATORY_CARE_PROVIDER_SITE_OTHER): Payer: Self-pay | Admitting: *Deleted

## 2025-01-08 DIAGNOSIS — E039 Hypothyroidism, unspecified: Secondary | ICD-10-CM | POA: Diagnosis not present

## 2025-01-08 DIAGNOSIS — K648 Other hemorrhoids: Secondary | ICD-10-CM | POA: Diagnosis not present

## 2025-01-08 DIAGNOSIS — K5732 Diverticulitis of large intestine without perforation or abscess without bleeding: Secondary | ICD-10-CM | POA: Insufficient documentation

## 2025-01-08 DIAGNOSIS — Z1211 Encounter for screening for malignant neoplasm of colon: Secondary | ICD-10-CM | POA: Diagnosis present

## 2025-01-08 DIAGNOSIS — K219 Gastro-esophageal reflux disease without esophagitis: Secondary | ICD-10-CM | POA: Insufficient documentation

## 2025-01-08 DIAGNOSIS — K5730 Diverticulosis of large intestine without perforation or abscess without bleeding: Secondary | ICD-10-CM | POA: Diagnosis not present

## 2025-01-08 DIAGNOSIS — K6389 Other specified diseases of intestine: Secondary | ICD-10-CM | POA: Diagnosis not present

## 2025-01-08 DIAGNOSIS — K5140 Inflammatory polyps of colon without complications: Secondary | ICD-10-CM | POA: Insufficient documentation

## 2025-01-08 HISTORY — PX: COLONOSCOPY: SHX5424

## 2025-01-08 HISTORY — PX: POLYPECTOMY: SHX149

## 2025-01-08 LAB — HM COLONOSCOPY

## 2025-01-08 MED ORDER — PROPOFOL 500 MG/50ML IV EMUL
INTRAVENOUS | Status: DC | PRN
  Administered 2025-01-08: 150 ug/kg/min via INTRAVENOUS

## 2025-01-08 MED ORDER — LACTATED RINGERS IV SOLN: INTRAVENOUS | Status: DC

## 2025-01-08 MED ORDER — LIDOCAINE 2% (20 MG/ML) 5 ML SYRINGE
INTRAMUSCULAR | Status: DC | PRN
  Administered 2025-01-08: 100 mg via INTRAVENOUS

## 2025-01-08 MED ORDER — PROPOFOL 10 MG/ML IV BOLUS
INTRAVENOUS | Status: DC | PRN
  Administered 2025-01-08: 100 mg via INTRAVENOUS

## 2025-01-08 NOTE — Transfer of Care (Signed)
 Immediate Anesthesia Transfer of Care Note  Patient: Sandy White  Procedure(s) Performed: COLONOSCOPY POLYPECTOMY, INTESTINE  Patient Location: Short Stay  Anesthesia Type:MAC  Level of Consciousness: awake, oriented, and patient cooperative  Airway & Oxygen Therapy: Patient Spontanous Breathing  Post-op Assessment: Report given to RN and Post -op Vital signs reviewed and stable  Post vital signs: Reviewed and stable  Last Vitals:  Vitals Value Taken Time  BP 109/66 01/08/25 13:49  Temp    Pulse 68 01/08/25 13:49  Resp 20 01/08/25 13:49  SpO2 97% on RA 01/08/25 13:49    Last Pain:  Vitals:   01/08/25 1306  TempSrc: Oral  PainSc: 0-No pain      Patients Stated Pain Goal: 5 (01/08/25 1306)  Complications: No notable events documented.

## 2025-01-08 NOTE — Interval H&P Note (Signed)
 History and Physical Interval Note:  01/08/2025 12:58 PM  Sandy White  has presented today for surgery, with the diagnosis of screening, diverticulitis, diarrhea.  The various methods of treatment have been discussed with the patient and family. After consideration of risks, benefits and other options for treatment, the patient has consented to  Procedures with comments: COLONOSCOPY (N/A) - 2:15pm, ASA 2 as a surgical intervention.  The patient's history has been reviewed, patient examined, no change in status, stable for surgery.  I have reviewed the patient's chart and labs.  Questions were answered to the patient's satisfaction.     Carlin MARLA Hasty

## 2025-01-08 NOTE — Op Note (Signed)
 [facility] Patient Name: Sandy White Procedure Date: 01/08/2025 12:53 PM MRN: [id number] Date of Birth: 11-10-1961 Attending MD: Carlin POUR. Cindie , [place], [id number] CSN: [id number] Age: 64 Admit Type: Outpatient Procedure:                Colonoscopy Indications:              Screening for colorectal malignant neoplasm Providers:                Carlin POUR. Cindie, DO, [place], Daphne Mulch                            Technician, Technician Referring MD:              Medicines:                See the Anesthesia note for documentation of the                            administered medications Complications:            No immediate complications. Estimated Blood Loss:     Estimated blood loss was minimal. Procedure:                Pre-Anesthesia Assessment:                           - The anesthesia plan was to use monitored                            anesthesia care (MAC).                           After obtaining informed consent, the colonoscope                            was passed under direct vision. Throughout the                            procedure, the patient's blood pressure, pulse, and                            oxygen saturations were monitored continuously. The                            [id number]-HQ190L ([id number]) Peds Colon was introduced                            through the anus and advanced to the the terminal                            ileum, with identification of the appendiceal                            orifice and IC valve. The colonoscopy was performed                            without difficulty. The patient  tolerated the                            procedure well. The quality of the bowel                            preparation was evaluated using the BBPS [facility]                            Bowel Preparation Scale) with scores of: Right                            Colon = 3, Transverse Colon = 3 and Left Colon = 3                            (entire mucosa seen  well with no residual staining,                            small fragments of stool or opaque liquid). The                            total BBPS score equals 9. Scope In: 1:26:37 PM Scope Out: 1:43:13 PM Scope Withdrawal Time: 0 hours 13 minutes 13 seconds  Total Procedure Duration: 0 hours 16 minutes 36 seconds  Findings:      Non-bleeding internal hemorrhoids were found.      Multiple large-mouthed and small-mouthed diverticula were found in the       sigmoid colon and descending colon.      A localized area of mucosa in the terminal ileum was nodular. ?lymphoid       hyperplasia Biopsies were taken with a cold forceps for histology.      A 5 mm polyp was found in the sigmoid colon. The polyp was sessile. The       polyp was removed with a cold snare. Resection and retrieval were       complete.      The exam was otherwise without abnormality. Impression:               - Non-bleeding internal hemorrhoids.                           - Diverticulosis in the sigmoid colon and in the                            descending colon.                           - Nodular ileal mucosa. Biopsied.                           - One 5 mm polyp in the sigmoid colon, removed with                            a cold snare. Resected and retrieved.                           -  The examination was otherwise normal. Moderate Sedation:      Per Anesthesia Care Recommendation:           - Patient has a contact number available for                            emergencies. The signs and symptoms of potential                            delayed complications were discussed with the                            patient. Return to normal activities tomorrow.                            Written discharge instructions were provided to the                            patient.                           - Resume previous diet.                           - Continue present medications.                           - Await pathology  results.                           - Repeat colonoscopy in 7-10 years depending on                            pathology results for surveillance.                           - Return to GI clinic in 3 months. Procedure Code(s):        --- Professional ---                           [phone], Colonoscopy, flexible; with removal of                            tumor(s), polyp(s), or other lesion(s) by snare                            technique                           45380, 59, Colonoscopy, flexible; with biopsy,                            single or multiple Diagnosis Code(s):        --- Professional ---                           Z12.11, Encounter for screening for malignant  neoplasm of colon                           K64.8, Other hemorrhoids                           K63.89, Other specified diseases of intestine                           D12.5, Benign neoplasm of sigmoid colon                           K57.30, Diverticulosis of large intestine without                            perforation or abscess without bleeding CPT copyright 2022 American [employer] Association. All rights reserved. The codes documented in this report are preliminary and upon coder review may  be revised to meet current compliance requirements. Carlin POUR. Cindie, DO Carlin POUR. Cindie, DO 01/08/2025 1:46:13 PM This report has been signed electronically. Number of Addenda: 0

## 2025-01-08 NOTE — Anesthesia Preprocedure Evaluation (Signed)
"                                    Anesthesia Evaluation  Patient identified by MRN, date of birth, ID band Patient awake    Reviewed: Allergy & Precautions, H&P , NPO status , Patient's Chart, lab work & pertinent test results  History of Anesthesia Complications (+) PONV and history of anesthetic complications  Airway Mallampati: II  TM Distance: >3 FB Neck ROM: Full    Dental no notable dental hx.    Pulmonary neg pulmonary ROS   Pulmonary exam normal breath sounds clear to auscultation       Cardiovascular negative cardio ROS Normal cardiovascular exam Rhythm:Regular Rate:Normal     Neuro/Psych negative neurological ROS  negative psych ROS   GI/Hepatic Neg liver ROS,GERD  ,,  Endo/Other  Hypothyroidism    Renal/GU negative Renal ROS  negative genitourinary   Musculoskeletal negative musculoskeletal ROS (+)    Abdominal   Peds negative pediatric ROS (+)  Hematology  (+) Blood dyscrasia, anemia   Anesthesia Other Findings   Reproductive/Obstetrics negative OB ROS                              Anesthesia Physical Anesthesia Plan  ASA: 2  Anesthesia Plan: General   Post-op Pain Management:    Induction: Intravenous  PONV Risk Score and Plan:   Airway Management Planned: Nasal Cannula and Natural Airway  Additional Equipment:   Intra-op Plan:   Post-operative Plan:   Informed Consent: I have reviewed the patients History and Physical, chart, labs and discussed the procedure including the risks, benefits and alternatives for the proposed anesthesia with the patient or authorized representative who has indicated his/her understanding and acceptance.     Dental advisory given  Plan Discussed with: CRNA  Anesthesia Plan Comments:         Anesthesia Quick Evaluation  "

## 2025-01-08 NOTE — Discharge Instructions (Addendum)
" °  Colonoscopy Discharge Instructions  Read the instructions outlined below and refer to this sheet in the next few weeks. These discharge instructions provide you with general information on caring for yourself after you leave the hospital. Your doctor may also give you specific instructions. While your treatment has been planned according to the most current medical practices available, unavoidable complications occasionally occur.   ACTIVITY You may resume your regular activity, but move at a slower pace for the next 24 hours.  Take frequent rest periods for the next 24 hours.  Walking will help get rid of the air and reduce the bloated feeling in your belly (abdomen).  No driving for 24 hours (because of the medicine (anesthesia) used during the test).   Do not sign any important legal documents or operate any machinery for 24 hours (because of the anesthesia used during the test).  NUTRITION Drink plenty of fluids.  You may resume your normal diet as instructed by your doctor.  Begin with a light meal and progress to your normal diet. Heavy or fried foods are harder to digest and may make you feel sick to your stomach (nauseated).  Avoid alcoholic beverages for 24 hours or as instructed.  MEDICATIONS You may resume your normal medications unless your doctor tells you otherwise.  WHAT YOU CAN EXPECT TODAY Some feelings of bloating in the abdomen.  Passage of more gas than usual.  Spotting of blood in your stool or on the toilet paper.  IF YOU HAD POLYPS REMOVED DURING THE COLONOSCOPY: No aspirin products for 7 days or as instructed.  No alcohol for 7 days or as instructed.  Eat a soft diet for the next 24 hours.  FINDING OUT THE RESULTS OF YOUR TEST Not all test results are available during your visit. If your test results are not back during the visit, make an appointment with your caregiver to find out the results. Do not assume everything is normal if you have not heard from your  caregiver or the medical facility. It is important for you to follow up on all of your test results.  SEEK IMMEDIATE MEDICAL ATTENTION IF: You have more than a spotting of blood in your stool.  Your belly is swollen (abdominal distention).  You are nauseated or vomiting.  You have a temperature over 101.  You have abdominal pain or discomfort that is severe or gets worse throughout the day.   Your colonoscopy revealed 1 polyp(s) which I removed successfully. Await pathology results, my office will contact you. I recommend repeating colonoscopy in 7-10 years for surveillance purposes, depending on pathology results.  You also have diverticulosis and internal hemorrhoids. I would recommend increasing fiber in your diet or adding OTC Benefiber/Metamucil. Be sure to drink at least 4 to 6 glasses of water daily.   The end portion of your small bowel was slightly nodular. This is likely due to benign condition. I took samples today.   Follow-up with GI in 3 months   I hope you have a great rest of your week!  Sandy White. Sandy White, D.O. Gastroenterology and Hepatology [facility]  "

## 2025-01-09 ENCOUNTER — Encounter (HOSPITAL_COMMUNITY): Payer: Self-pay | Admitting: Internal Medicine

## 2025-01-09 NOTE — Anesthesia Postprocedure Evaluation (Signed)
"   Anesthesia Post Note  Patient: ASA FATH  Procedure(s) Performed: COLONOSCOPY POLYPECTOMY, INTESTINE  Patient location during evaluation: PACU Anesthesia Type: General Level of consciousness: awake and alert Pain management: pain level controlled Vital Signs Assessment: post-procedure vital signs reviewed and stable Respiratory status: spontaneous breathing, nonlabored ventilation, respiratory function stable and patient connected to nasal cannula oxygen Cardiovascular status: blood pressure returned to baseline and stable Postop Assessment: no apparent nausea or vomiting Anesthetic complications: no   No notable events documented.   Last Vitals:  Vitals:   01/08/25 1306 01/08/25 1347  BP: 113/70 109/66  Pulse: 67 68  Resp: 11 (!) 21  Temp: 36.6 C (!) 36.4 C  SpO2: 99% 97%    Last Pain:  Vitals:   01/08/25 1347  TempSrc: Oral  PainSc: 0-No pain                 Andrea Limes      "

## 2025-01-11 LAB — SURGICAL PATHOLOGY

## 2025-04-10 ENCOUNTER — Ambulatory Visit: Admitting: Gastroenterology
